# Patient Record
Sex: Male | Born: 1975
Health system: Southern US, Community
[De-identification: ages and names within clinical notes are randomized; demographics above are authoritative.]

## PROBLEM LIST (undated history)

## (undated) HISTORY — PX: KNEE SURGERY: SHX244

---

## 1997-12-02 ENCOUNTER — Emergency Department (HOSPITAL_COMMUNITY): Admission: EM | Admit: 1997-12-02 | Discharge: 1997-12-02 | Payer: Self-pay | Admitting: Emergency Medicine

## 2007-10-29 ENCOUNTER — Ambulatory Visit (HOSPITAL_COMMUNITY): Admission: RE | Admit: 2007-10-29 | Discharge: 2007-10-29 | Payer: Self-pay | Admitting: Family Medicine

## 2011-07-16 ENCOUNTER — Emergency Department (HOSPITAL_COMMUNITY)
Admission: EM | Admit: 2011-07-16 | Discharge: 2011-07-16 | Disposition: A | Discharge status: Home / Self Care | Payer: BC Managed Care – PPO | Source: Home / Self Care | Attending: Emergency Medicine | Admitting: Emergency Medicine

## 2011-07-16 ENCOUNTER — Encounter (HOSPITAL_COMMUNITY): Payer: Self-pay | Admitting: *Deleted

## 2011-07-16 DIAGNOSIS — R05 Cough: Secondary | ICD-10-CM

## 2011-07-16 DIAGNOSIS — J45909 Unspecified asthma, uncomplicated: Secondary | ICD-10-CM

## 2011-07-16 DIAGNOSIS — R059 Cough, unspecified: Secondary | ICD-10-CM

## 2011-07-16 MED ORDER — ALBUTEROL SULFATE (5 MG/ML) 0.5% IN NEBU
2.5000 mg | INHALATION_SOLUTION | Freq: Once | RESPIRATORY_TRACT | Status: AC
  Administered 2011-07-16: 2.5 mg via RESPIRATORY_TRACT

## 2011-07-16 MED ORDER — ALBUTEROL SULFATE HFA 108 (90 BASE) MCG/ACT IN AERS: 1.0000 | INHALATION_SPRAY | Freq: Four times a day (QID) | RESPIRATORY_TRACT | Status: AC | PRN

## 2011-07-16 MED ORDER — ALBUTEROL SULFATE (5 MG/ML) 0.5% IN NEBU
INHALATION_SOLUTION | RESPIRATORY_TRACT | Status: AC
  Filled 2011-07-16: qty 0.5

## 2011-07-16 MED ORDER — CETIRIZINE-PSEUDOEPHEDRINE ER 5-120 MG PO TB12: 1.0000 | ORAL_TABLET | Freq: Every day | ORAL | Status: AC

## 2011-07-16 NOTE — ED Notes (Signed)
Pt with c/o right sided chest pain sharp grabbing type pain lasts a second or two - sore after occurs - denies sob - right side of chest tender to palpation - onset of episode x one two - three weeks ago and once today

## 2011-07-16 NOTE — Discharge Instructions (Signed)
Your symptoms and physical exam, consistent with reactive airways his possibly being triggered by allergies take his medicines as prescribed.

## 2011-07-16 NOTE — ED Provider Notes (Signed)
History     CSN: [id number]  Arrival date & time 07/16/11  1308   First MD Initiated Contact with Patient 07/16/11 1316      Chief Complaint  Patient presents with  . Chest Pain    (Consider location/radiation/quality/duration/timing/severity/associated sxs/prior treatment) HPI Comments: Patient describes that have had couple of episodes in which have expressed a sudden right-sided chest pain that lasted a couple seconds. He denies any pain now or any shortness of breath. Patient describes that for the last 4 days he's been coughing and has some degree of congestion of his nose stuffy and runny but no fevers. Patient denies any shortness of breath with activity, no nausea, and have not taking any over-the-counter medicines for his symptoms.  Patient is a 36 y.o. male presenting with chest pain. The history is provided by the patient.  Chest Pain The chest pain began 12 - 24 hours ago. Chest pain occurs frequently. At its most intense, the pain is at 4/10. The quality of the pain is described as aching. The pain does not radiate. Primary symptoms include cough. Pertinent negatives for primary symptoms include no fever, no syncope, no shortness of breath, no wheezing, no palpitations, no vomiting, no dizziness and no altered mental status.  Pertinent negatives for associated symptoms include no lower extremity edema and no numbness. He tried nothing for the symptoms.     History reviewed. No pertinent past medical history.  Past Surgical History  Procedure Date  . Knee surgery     History reviewed. No pertinent family history.  History  Substance Use Topics  . Smoking status: Never Smoker   . Smokeless tobacco: Not on file  . Alcohol Use:       Review of Systems  Constitutional: Positive for chills. Negative for fever and activity change.  Respiratory: Positive for cough and chest tightness. Negative for shortness of breath, wheezing and stridor.   Cardiovascular: Positive  for chest pain. Negative for palpitations and syncope.  Gastrointestinal: Negative for vomiting.  Neurological: Negative for dizziness and numbness.  Psychiatric/Behavioral: Negative for altered mental status.    Allergies  Review of patient's allergies indicates no known allergies.  Home Medications   Current Outpatient Rx  Name Route Sig Dispense Refill  . ALBUTEROL SULFATE HFA 108 (90 BASE) MCG/ACT IN AERS Inhalation Inhale 1-2 puffs into the lungs every 6 (six) hours as needed for wheezing. 1 Inhaler 0  . CETIRIZINE-PSEUDOEPHEDRINE ER 5-120 MG PO TB12 Oral Take 1 tablet by mouth daily. 30 tablet 0    BP 147/84  Pulse 68  Temp(Src) 98.7 F (37.1 C) (Oral)  Resp 18  SpO2 97%  Physical Exam  Nursing note and vitals reviewed. Constitutional: He appears well-developed and well-nourished.  Non-toxic appearance. He does not have a sickly appearance. He does not appear ill. No distress.    HENT:  Head: Normocephalic.  Eyes: Conjunctivae are normal.  Neck: Normal range of motion. No JVD present.  Cardiovascular: Normal rate, regular rhythm and normal heart sounds.  Exam reveals no gallop and no friction rub.   No murmur heard. Pulmonary/Chest: Effort normal. No respiratory distress. He has no wheezes. He has no rales.  Abdominal: Soft.  Lymphadenopathy:    He has no cervical adenopathy.  Neurological: He is alert.  Skin: No erythema.    ED Course  Procedures (including critical care time)  Labs Reviewed - No data to display No results found.   1. Cough   2. Reactive airway disease  MDM  Cough with coexistent upper respiratory congestion and reactive airway disease. Motor exam was much improved after a bronchodilator challenge. Patient is afebrile and during exam denied having any symptoms or chest pain.        Jimmie Molly, MD 07/16/11 1505

## 2015-05-10 DIAGNOSIS — N183 Chronic kidney disease, stage 3 unspecified: Secondary | ICD-10-CM | POA: Insufficient documentation

## 2015-05-10 DIAGNOSIS — I129 Hypertensive chronic kidney disease with stage 1 through stage 4 chronic kidney disease, or unspecified chronic kidney disease: Secondary | ICD-10-CM | POA: Insufficient documentation

## 2015-11-10 DIAGNOSIS — E669 Obesity, unspecified: Secondary | ICD-10-CM | POA: Insufficient documentation

## 2016-02-02 DIAGNOSIS — I10 Essential (primary) hypertension: Secondary | ICD-10-CM | POA: Diagnosis not present

## 2016-02-02 DIAGNOSIS — J019 Acute sinusitis, unspecified: Secondary | ICD-10-CM | POA: Diagnosis not present

## 2016-02-28 DIAGNOSIS — M10062 Idiopathic gout, left knee: Secondary | ICD-10-CM | POA: Diagnosis not present

## 2016-02-28 DIAGNOSIS — M25462 Effusion, left knee: Secondary | ICD-10-CM | POA: Diagnosis not present

## 2016-02-28 DIAGNOSIS — M25562 Pain in left knee: Secondary | ICD-10-CM | POA: Diagnosis not present

## 2016-02-28 DIAGNOSIS — M25461 Effusion, right knee: Secondary | ICD-10-CM | POA: Diagnosis not present

## 2016-03-01 DIAGNOSIS — M25562 Pain in left knee: Secondary | ICD-10-CM | POA: Diagnosis not present

## 2016-03-01 DIAGNOSIS — M10362 Gout due to renal impairment, left knee: Secondary | ICD-10-CM | POA: Diagnosis not present

## 2016-05-09 DIAGNOSIS — N183 Chronic kidney disease, stage 3 (moderate): Secondary | ICD-10-CM | POA: Diagnosis not present

## 2016-05-10 DIAGNOSIS — N183 Chronic kidney disease, stage 3 (moderate): Secondary | ICD-10-CM | POA: Diagnosis not present

## 2016-05-10 DIAGNOSIS — E669 Obesity, unspecified: Secondary | ICD-10-CM | POA: Diagnosis not present

## 2016-05-10 DIAGNOSIS — I129 Hypertensive chronic kidney disease with stage 1 through stage 4 chronic kidney disease, or unspecified chronic kidney disease: Secondary | ICD-10-CM | POA: Diagnosis not present

## 2016-08-09 DIAGNOSIS — M10362 Gout due to renal impairment, left knee: Secondary | ICD-10-CM | POA: Diagnosis not present

## 2016-08-15 DIAGNOSIS — I129 Hypertensive chronic kidney disease with stage 1 through stage 4 chronic kidney disease, or unspecified chronic kidney disease: Secondary | ICD-10-CM | POA: Diagnosis not present

## 2016-08-15 DIAGNOSIS — N183 Chronic kidney disease, stage 3 (moderate): Secondary | ICD-10-CM | POA: Diagnosis not present

## 2016-08-16 DIAGNOSIS — N183 Chronic kidney disease, stage 3 (moderate): Secondary | ICD-10-CM | POA: Diagnosis not present

## 2016-08-16 DIAGNOSIS — E669 Obesity, unspecified: Secondary | ICD-10-CM | POA: Diagnosis not present

## 2016-08-16 DIAGNOSIS — I129 Hypertensive chronic kidney disease with stage 1 through stage 4 chronic kidney disease, or unspecified chronic kidney disease: Secondary | ICD-10-CM | POA: Diagnosis not present

## 2016-08-16 DIAGNOSIS — M10362 Gout due to renal impairment, left knee: Secondary | ICD-10-CM | POA: Diagnosis not present

## 2016-09-22 DIAGNOSIS — M10362 Gout due to renal impairment, left knee: Secondary | ICD-10-CM | POA: Diagnosis not present

## 2016-09-22 DIAGNOSIS — N183 Chronic kidney disease, stage 3 (moderate): Secondary | ICD-10-CM | POA: Diagnosis not present

## 2016-09-25 DIAGNOSIS — E669 Obesity, unspecified: Secondary | ICD-10-CM | POA: Diagnosis not present

## 2016-09-25 DIAGNOSIS — N183 Chronic kidney disease, stage 3 (moderate): Secondary | ICD-10-CM | POA: Diagnosis not present

## 2016-09-25 DIAGNOSIS — I129 Hypertensive chronic kidney disease with stage 1 through stage 4 chronic kidney disease, or unspecified chronic kidney disease: Secondary | ICD-10-CM | POA: Diagnosis not present

## 2017-01-24 DIAGNOSIS — N183 Chronic kidney disease, stage 3 (moderate): Secondary | ICD-10-CM | POA: Diagnosis not present

## 2017-01-25 DIAGNOSIS — I129 Hypertensive chronic kidney disease with stage 1 through stage 4 chronic kidney disease, or unspecified chronic kidney disease: Secondary | ICD-10-CM | POA: Diagnosis not present

## 2017-01-25 DIAGNOSIS — E669 Obesity, unspecified: Secondary | ICD-10-CM | POA: Diagnosis not present

## 2017-01-25 DIAGNOSIS — N183 Chronic kidney disease, stage 3 (moderate): Secondary | ICD-10-CM | POA: Diagnosis not present

## 2017-07-27 DIAGNOSIS — N183 Chronic kidney disease, stage 3 (moderate): Secondary | ICD-10-CM | POA: Diagnosis not present

## 2017-07-30 DIAGNOSIS — I129 Hypertensive chronic kidney disease with stage 1 through stage 4 chronic kidney disease, or unspecified chronic kidney disease: Secondary | ICD-10-CM | POA: Diagnosis not present

## 2017-07-30 DIAGNOSIS — E559 Vitamin D deficiency, unspecified: Secondary | ICD-10-CM | POA: Diagnosis not present

## 2017-07-30 DIAGNOSIS — N183 Chronic kidney disease, stage 3 (moderate): Secondary | ICD-10-CM | POA: Diagnosis not present

## 2017-07-30 DIAGNOSIS — E669 Obesity, unspecified: Secondary | ICD-10-CM | POA: Diagnosis not present

## 2017-10-30 ENCOUNTER — Encounter: Payer: Self-pay | Admitting: Family Medicine

## 2017-10-30 ENCOUNTER — Ambulatory Visit (INDEPENDENT_AMBULATORY_CARE_PROVIDER_SITE_OTHER): Payer: BLUE CROSS/BLUE SHIELD | Admitting: Family Medicine

## 2017-10-30 VITALS — BP 149/90 | HR 106 | Ht 70.0 in | Wt 255.0 lb

## 2017-10-30 DIAGNOSIS — S8992XA Unspecified injury of left lower leg, initial encounter: Secondary | ICD-10-CM | POA: Diagnosis not present

## 2017-10-30 MED ORDER — DICLOFENAC SODIUM 1 % TD GEL: 2.0000 g | Freq: Four times a day (QID) | TRANSDERMAL | 1 refills | Status: AC

## 2017-10-30 MED ORDER — HYDROCODONE-ACETAMINOPHEN 5-325 MG PO TABS: 1.0000 | ORAL_TABLET | Freq: Four times a day (QID) | ORAL | 0 refills | Status: DC | PRN

## 2017-10-30 NOTE — Patient Instructions (Signed)
You partially tore your patellar tendon. Wear immobilizer for next 5 weeks when up and walking around. Try to keep your knee straight other times. Icing 15 minutes at a time 3-4 times a day. Voltaren gel up to 4 times a day topically. Norco as needed for severe pain. Consider aspercreme if insurance won't cover the voltaren gel. Follow up with me in 2 weeks for reevaluation.

## 2017-11-02 ENCOUNTER — Encounter: Payer: Self-pay | Admitting: Family Medicine

## 2017-11-03 NOTE — Progress Notes (Signed)
PCP: Andreas Blower., MD  Subjective:   HPI: Patient is a 42 y.o. male here for left knee pain.  Patient reports back in 2009 he injured his left knee playing football - tore patellar tendon and had reconstruction. He improved and has had flares of gout at times in this knee. Then about a week ago he recalls turning wrong and feeling sharp pain with gradual swelling of anterior left knee. Difficulty walking and straightening his left knee since then. No fever, redness, numbness, other skin changes. Not tried anything for this yet. At rest pain 0/10 but sharp with walking, trying to straighten knee.  History reviewed. No pertinent past medical history.  Current Outpatient Medications on File Prior to Visit  Medication Sig Dispense Refill  . albuterol (PROVENTIL HFA;VENTOLIN HFA) 108 (90 BASE) MCG/ACT inhaler Inhale 1-2 puffs into the lungs every 6 (six) hours as needed for wheezing. 1 Inhaler 0  . amLODipine (NORVASC) 10 MG tablet   4  . hydrochlorothiazide (HYDRODIURIL) 12.5 MG tablet TAKE 1 TABLET PO QD  5  . Vitamin D, Ergocalciferol, (DRISDOL) 50000 units CAPS capsule TK 1 C PO 1 TIME A WK  5   No current facility-administered medications on file prior to visit.     Past Surgical History:  Procedure Laterality Date  . KNEE SURGERY      No Known Allergies  Social History   Socioeconomic History  . Marital status: Single    Spouse name: Not on file  . Number of children: Not on file  . Years of education: Not on file  . Highest education level: Not on file  Occupational History  . Not on file  Social Needs  . Financial resource strain: Not on file  . Food insecurity:    Worry: Not on file    Inability: Not on file  . Transportation needs:    Medical: Not on file    Non-medical: Not on file  Tobacco Use  . Smoking status: Never Smoker  . Smokeless tobacco: Never Used  Substance and Sexual Activity  . Alcohol use: Not on file  . Drug use: No  . Sexual  activity: Not on file  Lifestyle  . Physical activity:    Days per week: Not on file    Minutes per session: Not on file  . Stress: Not on file  Relationships  . Social connections:    Talks on phone: Not on file    Gets together: Not on file    Attends religious service: Not on file    Active member of club or organization: Not on file    Attends meetings of clubs or organizations: Not on file    Relationship status: Not on file  . Intimate partner violence:    Fear of current or ex partner: Not on file    Emotionally abused: Not on file    Physically abused: Not on file    Forced sexual activity: Not on file  Other Topics Concern  . Not on file  Social History Narrative  . Not on file    History reviewed. No pertinent family history.  BP (!) 149/90   Pulse (!) 106   Ht 5\' 10"  (1.778 m)   Wt 255 lb (115.7 kg)   BMI 36.59 kg/m   Review of Systems: See HPI above.     Objective:  Physical Exam:  Gen: NAD, comfortable in exam room  Left knee: No effusion.  Moderate amount of swelling superficial  to patellar tendon area.  Well healed anterior scar.  No other deformity.  No warmth, redness. TTP over patellar tendon.  No joint line, suprapatellar, other tenderness. FROM but with 3/5 strength and pain with extension.  5/5 strength flexion. Negative ant/post drawers. Negative valgus/varus testing. Negative lachmanns. Negative mcmurrays, apleys, patellar apprehension. NV intact distally.  Right knee: No deformity. FROM with 5/5 strength. No tenderness to palpation. NVI distally.  MSK u/s left knee:  Quad tendon intact.  Minimal effusion.  Patellar tendon has fibers visible from patella to insertion on tibial tubercle however a large hematoma, disruption noted superficial to the tendon suggestive of partial tear with hematoma.   Assessment & Plan:  1. Left knee injury - history, exam, ultrasound all consistent with partial patellar tendon tear.  Noted many fibers  still present so will treat conservatively.  Knee immobilizer for next 5 weeks with ambulation.  Stressed importance of keeping knee straight.  Icing, voltaren gel with norco as needed.  F/u in 2 weeks.

## 2017-11-13 ENCOUNTER — Encounter: Payer: Self-pay | Admitting: Family Medicine

## 2017-11-13 ENCOUNTER — Ambulatory Visit (INDEPENDENT_AMBULATORY_CARE_PROVIDER_SITE_OTHER): Payer: BLUE CROSS/BLUE SHIELD | Admitting: Family Medicine

## 2017-11-13 VITALS — BP 145/100 | HR 101 | Ht 71.0 in | Wt 255.0 lb

## 2017-11-13 DIAGNOSIS — S8992XD Unspecified injury of left lower leg, subsequent encounter: Secondary | ICD-10-CM | POA: Diagnosis not present

## 2017-11-13 NOTE — Patient Instructions (Signed)
You partially tore your patellar tendon. You're healing as expected. Wear immobilizer for next 3-4 weeks when up and walking around and follow up with me at that time. Plan to start physical therapy right after that 2 times a week. Try to keep your knee straight other times. Icing 15 minutes at a time 3-4 times a day. Norco as needed for severe pain.

## 2017-11-13 NOTE — Progress Notes (Signed)
PCP: Andreas Blower., MD  Subjective:   HPI: Patient is a 42 y.o. male here for left knee pain.  9/10: Patient reports back in 2009 he injured his left knee playing football - tore patellar tendon and had reconstruction. He improved and has had flares of gout at times in this knee. Then about a week ago he recalls turning wrong and feeling sharp pain with gradual swelling of anterior left knee. Difficulty walking and straightening his left knee since then. No fever, redness, numbness, other skin changes. Not tried anything for this yet. At rest pain 0/10 but sharp with walking, trying to straighten knee.  9/24: Patient reports he's doing better. Pain level down to 3/10. Wearing bledsoe brace locked to full extension. Has been icing, taking some ibuprofen, rare norco. No skin changes.  History reviewed. No pertinent past medical history.  Current Outpatient Medications on File Prior to Visit  Medication Sig Dispense Refill  . albuterol (PROVENTIL HFA;VENTOLIN HFA) 108 (90 BASE) MCG/ACT inhaler Inhale 1-2 puffs into the lungs every 6 (six) hours as needed for wheezing. 1 Inhaler 0  . allopurinol (ZYLOPRIM) 100 MG tablet   5  . amLODipine (NORVASC) 10 MG tablet   4  . diclofenac sodium (VOLTAREN) 1 % GEL Apply 2 g topically 4 (four) times daily. 3 Tube 1  . hydrochlorothiazide (HYDRODIURIL) 12.5 MG tablet TAKE 1 TABLET PO QD  5  . HYDROcodone-acetaminophen (NORCO) 5-325 MG tablet Take 1 tablet by mouth every 6 (six) hours as needed for moderate pain. 20 tablet 0  . Vitamin D, Ergocalciferol, (DRISDOL) 50000 units CAPS capsule TK 1 C PO 1 TIME A WK  5   No current facility-administered medications on file prior to visit.     Past Surgical History:  Procedure Laterality Date  . KNEE SURGERY      No Known Allergies  Social History   Socioeconomic History  . Marital status: Single    Spouse name: Not on file  . Number of children: Not on file  . Years of education: Not on  file  . Highest education level: Not on file  Occupational History  . Not on file  Social Needs  . Financial resource strain: Not on file  . Food insecurity:    Worry: Not on file    Inability: Not on file  . Transportation needs:    Medical: Not on file    Non-medical: Not on file  Tobacco Use  . Smoking status: Never Smoker  . Smokeless tobacco: Never Used  Substance and Sexual Activity  . Alcohol use: Not on file  . Drug use: No  . Sexual activity: Not on file  Lifestyle  . Physical activity:    Days per week: Not on file    Minutes per session: Not on file  . Stress: Not on file  Relationships  . Social connections:    Talks on phone: Not on file    Gets together: Not on file    Attends religious service: Not on file    Active member of club or organization: Not on file    Attends meetings of clubs or organizations: Not on file    Relationship status: Not on file  . Intimate partner violence:    Fear of current or ex partner: Not on file    Emotionally abused: Not on file    Physically abused: Not on file    Forced sexual activity: Not on file  Other Topics Concern  .  Not on file  Social History Narrative  . Not on file    History reviewed. No pertinent family history.  BP (!) 145/100   Pulse (!) 101   Ht 5\' 11"  (1.803 m)   Wt 255 lb (115.7 kg)   BMI 35.57 kg/m   Review of Systems: See HPI above.     Objective:  Physical Exam:  Gen: NAD, comfortable in exam room  Left knee: Swelling decreased, mild superficial to patellar tendon.  No effusion.  Well healed anterior scar.  No other deformity.  No warmth, redness. Mild TTP patellar tendon, improved. Full extension.  Strength 5/5 with extension.  Did not test flexion Negative valgus/varus testing.  NV intact distally.  MSK u/s left knee:  Quad tendon intact.  No effusion.  Patellar tendon with most fibers intact;  Disruption of superficial portion of tendon.  Hematoma improved.   Assessment &  Plan:  1. Left knee injury - 2/2 partial patellar tendon tear.  Continue immobilizer out to 6 weeks and follow up at that time.  Icing, ibuprofen with norco as needed.  Plan to start PT at follow-up, transition to knee brace.

## 2017-12-04 ENCOUNTER — Encounter: Payer: Self-pay | Admitting: Family Medicine

## 2017-12-04 ENCOUNTER — Ambulatory Visit (INDEPENDENT_AMBULATORY_CARE_PROVIDER_SITE_OTHER): Payer: BLUE CROSS/BLUE SHIELD | Admitting: Family Medicine

## 2017-12-04 VITALS — BP 159/107 | HR 98 | Ht 71.0 in | Wt 255.0 lb

## 2017-12-04 DIAGNOSIS — S8992XD Unspecified injury of left lower leg, subsequent encounter: Secondary | ICD-10-CM

## 2017-12-04 NOTE — Progress Notes (Signed)
PCP: Andreas Blower., MD  Subjective:   HPI: Patient is a 42 y.o. male here for left knee pain.  9/10: Patient reports back in 2009 he injured his left knee playing football - tore patellar tendon and had reconstruction. He improved and has had flares of gout at times in this knee. Then about a week ago he recalls turning wrong and feeling sharp pain with gradual swelling of anterior left knee. Difficulty walking and straightening his left knee since then. No fever, redness, numbness, other skin changes. Not tried anything for this yet. At rest pain 0/10 but sharp with walking, trying to straighten knee.  9/24: Patient reports he's doing better. Pain level down to 3/10. Wearing bledsoe brace locked to full extension. Has been icing, taking some ibuprofen, rare norco. No skin changes.  10/15: Patient reports he feels about the same. Pain level 2/10, feels with leaning on it or kneeling down. He's wearing his bledsoe brace when up and walking around. No skin changes.  History reviewed. No pertinent past medical history.  Current Outpatient Medications on File Prior to Visit  Medication Sig Dispense Refill  . albuterol (PROVENTIL HFA;VENTOLIN HFA) 108 (90 BASE) MCG/ACT inhaler Inhale 1-2 puffs into the lungs every 6 (six) hours as needed for wheezing. 1 Inhaler 0  . allopurinol (ZYLOPRIM) 100 MG tablet   5  . amLODipine (NORVASC) 10 MG tablet   4  . diclofenac sodium (VOLTAREN) 1 % GEL Apply 2 g topically 4 (four) times daily. 3 Tube 1  . hydrochlorothiazide (HYDRODIURIL) 12.5 MG tablet TAKE 1 TABLET PO QD  5  . HYDROcodone-acetaminophen (NORCO) 5-325 MG tablet Take 1 tablet by mouth every 6 (six) hours as needed for moderate pain. 20 tablet 0  . Vitamin D, Ergocalciferol, (DRISDOL) 50000 units CAPS capsule TK 1 C PO 1 TIME A WK  5   No current facility-administered medications on file prior to visit.     Past Surgical History:  Procedure Laterality Date  . KNEE SURGERY       No Known Allergies  Social History   Socioeconomic History  . Marital status: Single    Spouse name: Not on file  . Number of children: Not on file  . Years of education: Not on file  . Highest education level: Not on file  Occupational History  . Not on file  Social Needs  . Financial resource strain: Not on file  . Food insecurity:    Worry: Not on file    Inability: Not on file  . Transportation needs:    Medical: Not on file    Non-medical: Not on file  Tobacco Use  . Smoking status: Never Smoker  . Smokeless tobacco: Never Used  Substance and Sexual Activity  . Alcohol use: Not on file  . Drug use: No  . Sexual activity: Not on file  Lifestyle  . Physical activity:    Days per week: Not on file    Minutes per session: Not on file  . Stress: Not on file  Relationships  . Social connections:    Talks on phone: Not on file    Gets together: Not on file    Attends religious service: Not on file    Active member of club or organization: Not on file    Attends meetings of clubs or organizations: Not on file    Relationship status: Not on file  . Intimate partner violence:    Fear of current or ex partner:  Not on file    Emotionally abused: Not on file    Physically abused: Not on file    Forced sexual activity: Not on file  Other Topics Concern  . Not on file  Social History Narrative  . Not on file    History reviewed. No pertinent family history.  BP (!) 159/107   Pulse 98   Ht 5\' 11"  (1.803 m)   Wt 255 lb (115.7 kg)   BMI 35.57 kg/m   Review of Systems: See HPI above.     Objective:  Physical Exam:  Gen: NAD, comfortable in exam room  Left knee: Mild superficial swelling over patellar tendon.  No effusion.  Well healed anterior scar.  No other deformity.  Mild TTP patellar tendon.  No other tenderness. ROM 0 - 90 degrees with 5/5 strength flexion and extension. Negative ant/post drawers. Negative valgus/varus testing. Negative  lachmans. Negative mcmurrays, apleys, patellar apprehension. NV intact distally.  MSK u/s left knee:  Most of patellar tendon intact with only superficial tendon disrupted.  Neovascularity increased in patellar tendon as well.     Assessment & Plan:  1. Left knee injury - 2/2 partial patellar tendon tear with tendinitis.  Switch to hinged knee brace at this time and start physical therapy.  Icing, tylenol, topical aspercreme or capsaicin.  F/u in 4 weeks.  Hopefully can return to light duty at that time for a couple weeks at minimum then back to full duty.

## 2017-12-04 NOTE — Patient Instructions (Signed)
You partially tore your patellar tendon. You're healing as expected. Knee brace when up and walking around. Start physical therapy twice a week for at least next 4 weeks. Follow up with me in 4 weeks. Icing 15 minutes at a time 3-4 times a day. Tylenol, topical aspercreme or capsaicin as needed.

## 2017-12-20 ENCOUNTER — Ambulatory Visit: Payer: BLUE CROSS/BLUE SHIELD | Attending: Family Medicine | Admitting: Physical Therapy

## 2017-12-20 ENCOUNTER — Encounter: Payer: Self-pay | Admitting: Physical Therapy

## 2017-12-20 DIAGNOSIS — M25562 Pain in left knee: Secondary | ICD-10-CM | POA: Diagnosis not present

## 2017-12-20 DIAGNOSIS — M6281 Muscle weakness (generalized): Secondary | ICD-10-CM

## 2017-12-20 NOTE — Therapy (Signed)
[facility] [address] [place], [place], [address] Phone: [phone]   Fax:  [phone]  Physical Therapy Evaluation  Patient Details  Name: Calvin Schmidt MRN: [id number] Date of Birth: Oct 26, 1975 Referring Provider (PT): Norton Blizzard, MD   Encounter Date: 12/20/2017  PT End of Session - 12/20/17 1152    Visit Number  1    Number of Visits  12    Date for PT Re-Evaluation  01/31/18    Authorization Type  [employer]    PT Start Time  0935    PT Stop Time  1015    PT Time Calculation (min)  40 min    Activity Tolerance  Patient tolerated treatment well    Behavior During Therapy  [facility] for tasks assessed/performed       History reviewed. No pertinent past medical history.  Past Surgical History:  Procedure Laterality Date  . KNEE SURGERY      There were no vitals filed for this visit.   Subjective Assessment - 12/20/17 0936    Subjective  Pt relays he had Lt knee patella reconstruction in 2009, then he re-injured his knee in September. He saw MD, they took U.S showing it was partially torn. He was then immobilized for 5 weeks and taken out of work. 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  He now has pain and difficulty with squatitng, getting out of car, and stairs.    Pertinent History  PMH: CKD, obesity, prev knee surgery    Diagnostic tests  MSK u/s left knee:  Most of patellar tendon intact with only superficial tendon disrupted.  Neovascularity increased in patellar tendon as well.      Currently in Pain?  Yes    Pain Score  6    6 if squatting, no pain at rest   Pain Descriptors / Indicators  Throbbing    Pain Type  Acute pain    Pain Onset  More than a month ago    Pain Frequency  Intermittent    Aggravating Factors   squatting, stairs    Pain Relieving Factors  ice         [facility] PT Assessment - 12/20/17 0001      Assessment    Medical Diagnosis  Lt knee pain, partially torn patella tendon    Referring Provider (PT)  Norton Blizzard, MD    Next MD Visit  01/01/18    Prior Therapy  none      Precautions   Precautions  None      Restrictions   Weight Bearing Restrictions  No      Home Environment   Living Environment  Private residence    Additional Comments  14 steps, has to perform one step at a time currently      Prior Function   Level of Independence  Independent    Vocation  Part time employment    Vocation Requirements  builds busses      Observation/Other Assessments   Focus on Therapeutic Outcomes (FOTO)   45% limited      Sensation   Light Touch  Appears Intact      Functional Tests   Functional tests  Squat      Squat   Comments  75% ROM with squat before pain, inc wt shifting to Rt LE      ROM / Strength  AROM / PROM / Strength  AROM;Strength      AROM   Overall AROM Comments  WFL equal to Rt knee 0-120 deg bilat      Strength   Overall Strength Comments  Lt hip strength 5/5,     Strength Assessment Site  Knee    Right/Left Knee  Left    Left Knee Flexion  5/5    Left Knee Extension  4/5   limited by pain     Flexibility   Soft Tissue Assessment /Muscle Length  --   mild tightness in H.S, mod tightness in quad     Palpation   Palpation comment  TTP patella tendon      Ambulation/Gait   Gait Comments  WNL pattern                Objective measurements completed on examination: See above findings.      [facility] Adult PT Treatment/Exercise - 12/20/17 0001      Exercises   Exercises  Knee/Hip      Knee/Hip Exercises: Stretches   Quad Stretch  2 reps;Left;30 seconds    Quad Stretch Limitations  prone with strap      Knee/Hip Exercises: Seated   Long Arc Quad  Left;10 reps    Long Arc Quad Limitations  with ball sq    Sit to [employer]  10 reps   with Rt LE slight in front to facilitate more Lt LE wt shift     Knee/Hip Exercises: Supine   Quad Sets  10 reps    hold 5 sec   Straight Leg Raises  10 reps      Knee/Hip Exercises: Sidelying   Hip ABduction  10 reps    Hip ABduction Limitations  SLR, heel against wall for form             PT Education - 12/20/17 1151    Education Details  HEP, POC    Person(s) Educated  Patient    Methods  Explanation;Demonstration;Verbal cues;Handout    Comprehension  Verbalized understanding;Returned demonstration          PT Long Term Goals - 12/20/17 1156      PT LONG TERM GOAL #1   Title  Pt will be I and compliant with HEP. 6 weeks 01/31/18    Status  New      PT LONG TERM GOAL #2   Title  Pt will improve FOTO to less than 32% limited to improve function. 6 weeks 01/31/18    Status  New      PT LONG TERM GOAL #3   Title  Pt will improve Lt quad strength to 5/5 MMT. 6 weeks 01/31/18    Status  New      PT LONG TERM GOAL #4   Title  Pt will be able to lift at least 75 lbs from floor to waist without pain or difficulty. 6 weeks 01/31/18      PT LONG TERM GOAL #5   Title  Pt will be able to perform 14 stairs reciprocally without UE support or pain. 6 weeks 01/31/18    Status  New             Plan - 12/20/17 1152    Clinical Impression Statement  Pt presents with Lt knee pain and stiffness from partially torn patella tendon confirmed on U.S. He has decreased strength and stability, decreased activity toleance and decreased ability for squatting, lifting, and stairs. He will  benefit from skilled PT to address his deficits.     History and Personal Factors relevant to plan of care:  PMH: CKD, obesity, prev knee surgery for patella tendon reconstruction in 09    Clinical Presentation  Evolving    Clinical Presentation due to:  worsening pain since steptember    Clinical Decision Making  Moderate    Rehab Potential  Good    Clinical Impairments Affecting Rehab Potential  physical nature of job, and second time tearing patella tendon    PT Frequency  2x / week    PT Duration  6 weeks     PT Treatment/Interventions  Cryotherapy;Electrical Stimulation;Iontophoresis 4mg /ml Dexamethasone;Moist Heat;Ultrasound;Stair training;Therapeutic exercise;Therapeutic activities;Neuromuscular re-education;Passive range of motion;Dry needling;Taping;Vasopneumatic Device    PT Next Visit Plan  assess HEP, functional quad strengthening and progress squatting as able, quad stretching    Consulted and Agree with Plan of Care  Patient       Patient will benefit from skilled therapeutic intervention in order to improve the following deficits and impairments:  Decreased activity tolerance, Decreased endurance, Decreased range of motion, Decreased strength, Impaired flexibility, Pain  Visit Diagnosis: Acute pain of left knee  Muscle weakness (generalized)     Problem List Patient Active Problem List   Diagnosis Date Noted  . Obesity (BMI 30-39.9) 11/10/2015  . Benign hypertension with CKD (chronic kidney disease) stage III (HCC) 05/10/2015    April Manson, PT, DPT 12/20/2017, 12:03 PM  [facility] [address] [place], [place], [address] Phone: [phone]   Fax:  [phone]  Name: Calvin Schmidt MRN: [id number] Date of Birth: 1975/11/07

## 2017-12-28 ENCOUNTER — Ambulatory Visit: Payer: BLUE CROSS/BLUE SHIELD | Attending: Family Medicine | Admitting: Physical Therapy

## 2017-12-28 ENCOUNTER — Encounter: Payer: Self-pay | Admitting: Physical Therapy

## 2017-12-28 DIAGNOSIS — M6281 Muscle weakness (generalized): Secondary | ICD-10-CM | POA: Diagnosis not present

## 2017-12-28 DIAGNOSIS — M25562 Pain in left knee: Secondary | ICD-10-CM | POA: Diagnosis not present

## 2017-12-28 NOTE — Therapy (Signed)
[facility] [address] [place], [place], [address] Phone: [phone]   Fax:  [phone]  Physical Therapy Treatment  Patient Details  Name: Calvin Schmidt MRN: [id number] Date of Birth: 06/05/1975 Referring Provider (PT): Norton Blizzard, MD   Encounter Date: 12/28/2017  PT End of Session - 12/28/17 1012    Visit Number  2    Date for PT Re-Evaluation  01/31/18    PT Start Time  0927    PT Stop Time  1015    PT Time Calculation (min)  48 min    Activity Tolerance  Patient tolerated treatment well    Behavior During Therapy  [facility] for tasks assessed/performed       History reviewed. No pertinent past medical history.  Past Surgical History:  Procedure Laterality Date  . KNEE SURGERY      There were no vitals filed for this visit.  Subjective Assessment - 12/28/17 0928    Subjective  Pt reports that she is doing good overall.     Currently in Pain?  Yes    Pain Score  2     Pain Location  Knee    Pain Orientation  Left                       [facility] Adult PT Treatment/Exercise - 12/28/17 0001      Exercises   Exercises  Knee/Hip      Knee/Hip Exercises: Aerobic   Recumbent Bike  L2 x4 min     Nustep  L3 x4 min       Knee/Hip Exercises: Machines for Strengthening   Cybex Knee Extension  LLE 5lb 2x10    Cybex Knee Flexion  LLE 35lb 2x15    Cybex Leg Press  LLE 20lb 3x15       Knee/Hip Exercises: Standing   Forward Step Up  Left;10 reps;Hand Hold: 0;Step Height: 6";1 set    Other Standing Knee Exercises  Controlled decents 4in 2x10       Knee/Hip Exercises: Seated   Sit to Sand  2 sets;15 reps   6in on mat table, 4in on mat abel      Knee/Hip Exercises: Supine   Short Arc Quad Sets  Left;2 sets;10 reps    Short Arc Quad Sets Limitations  2lb    Straight Leg Raises  Left;2 sets;10 reps    Straight Leg Raises Limitations  2lb                  PT Long Term Goals - 12/28/17  1014      PT LONG TERM GOAL #1   Title  Pt will be I and compliant with HEP. 6 weeks 01/31/18    Status  Achieved            Plan - 12/28/17 1014    Clinical Impression Statement  Pt did well with all interventions overall, He reports the most difficulty with single leg extensions. Visible shaking with controlled descents during the eccentric phase. Some compensation during sit to stands even with elevated surface, pt tends to lean to his right    Rehab Potential  Good    Clinical Impairments Affecting Rehab Potential  physical nature of job, and second time tearing patella tendon    PT Frequency  2x / week    PT Duration  6 weeks    PT Treatment/Interventions  Cryotherapy;Electrical Stimulation;Iontophoresis 4mg /ml Dexamethasone;Moist Heat;Ultrasound;Stair training;Therapeutic  exercise;Therapeutic activities;Neuromuscular re-education;Passive range of motion;Dry needling;Taping;Vasopneumatic Device    PT Next Visit Plan   functional quad strengthening and progress squatting as able, quad stretching       Patient will benefit from skilled therapeutic intervention in order to improve the following deficits and impairments:  Decreased activity tolerance, Decreased endurance, Decreased range of motion, Decreased strength, Impaired flexibility, Pain  Visit Diagnosis: Muscle weakness (generalized)  Acute pain of left knee     Problem List Patient Active Problem List   Diagnosis Date Noted  . Obesity (BMI 30-39.9) 11/10/2015  . Benign hypertension with CKD (chronic kidney disease) stage III (HCC) 05/10/2015    Grayce Sessions 12/28/2017, 10:19 AM  [facility] [address], [place], [address] Phone: [phone]   Fax:  [phone]  Name: Calvin Schmidt MRN: [id number] Date of Birth: 1975/12/16

## 2018-01-01 ENCOUNTER — Encounter: Payer: Self-pay | Admitting: Family Medicine

## 2018-01-01 ENCOUNTER — Ambulatory Visit (INDEPENDENT_AMBULATORY_CARE_PROVIDER_SITE_OTHER): Payer: BLUE CROSS/BLUE SHIELD | Admitting: Family Medicine

## 2018-01-01 VITALS — BP 169/98 | HR 97 | Ht 70.0 in | Wt 255.0 lb

## 2018-01-01 DIAGNOSIS — S8992XD Unspecified injury of left lower leg, subsequent encounter: Secondary | ICD-10-CM

## 2018-01-01 NOTE — Patient Instructions (Signed)
Knee brace when up and walking around. Do knee extensions 3 sets of 10 daily and half squats. We can get you into physical therapy elsewhere if it's too hard to get into the [facility] location - going once a week while you also do the home exercises would be ideal. Follow up with me in 5 weeks. Icing 15 minutes at a time 3-4 times a day. Tylenol, topical aspercreme or capsaicin as needed.

## 2018-01-01 NOTE — Progress Notes (Signed)
PCP: Andreas Blower., MD  Subjective:   HPI: Patient is a 42 y.o. male here for left knee pain.  9/10: Patient reports back in 2009 he injured his left knee playing football - tore patellar tendon and had reconstruction. He improved and has had flares of gout at times in this knee. Then about a week ago he recalls turning wrong and feeling sharp pain with gradual swelling of anterior left knee. Difficulty walking and straightening his left knee since then. No fever, redness, numbness, other skin changes. Not tried anything for this yet. At rest pain 0/10 but sharp with walking, trying to straighten knee.  9/24: Patient reports he's doing better. Pain level down to 3/10. Wearing bledsoe brace locked to full extension. Has been icing, taking some ibuprofen, rare norco. No skin changes.  10/15: Patient reports he feels about the same. Pain level 2/10, feels with leaning on it or kneeling down. He's wearing his bledsoe brace when up and walking around. No skin changes.  11/12: Patient reports he feels about the same. Has only done 2 visits of physical therapy to date and some home exercises. Had trouble doing even 10 pound knee extensions left leg. Wearing knee brace but not currently. Pain level 3/10 and sore, worse kneeling. Has been icing. No skin changes.  History reviewed. No pertinent past medical history.  Current Outpatient Medications on File Prior to Visit  Medication Sig Dispense Refill  . albuterol (PROVENTIL HFA;VENTOLIN HFA) 108 (90 BASE) MCG/ACT inhaler Inhale 1-2 puffs into the lungs every 6 (six) hours as needed for wheezing. 1 Inhaler 0  . allopurinol (ZYLOPRIM) 100 MG tablet   5  . amLODipine (NORVASC) 10 MG tablet   4  . diclofenac sodium (VOLTAREN) 1 % GEL Apply 2 g topically 4 (four) times daily. 3 Tube 1  . hydrochlorothiazide (HYDRODIURIL) 12.5 MG tablet TAKE 1 TABLET PO QD  5  . HYDROcodone-acetaminophen (NORCO) 5-325 MG tablet Take 1 tablet by  mouth every 6 (six) hours as needed for moderate pain. 20 tablet 0  . Vitamin D, Ergocalciferol, (DRISDOL) 50000 units CAPS capsule TK 1 C PO 1 TIME A WK  5   No current facility-administered medications on file prior to visit.     Past Surgical History:  Procedure Laterality Date  . KNEE SURGERY      No Known Allergies  Social History   Socioeconomic History  . Marital status: Single    Spouse name: Not on file  . Number of children: Not on file  . Years of education: Not on file  . Highest education level: Not on file  Occupational History  . Not on file  Social Needs  . Financial resource strain: Not on file  . Food insecurity:    Worry: Not on file    Inability: Not on file  . Transportation needs:    Medical: Not on file    Non-medical: Not on file  Tobacco Use  . Smoking status: Never Smoker  . Smokeless tobacco: Never Used  Substance and Sexual Activity  . Alcohol use: Not on file  . Drug use: No  . Sexual activity: Not on file  Lifestyle  . Physical activity:    Days per week: Not on file    Minutes per session: Not on file  . Stress: Not on file  Relationships  . Social connections:    Talks on phone: Not on file    Gets together: Not on file  Attends religious service: Not on file    Active member of club or organization: Not on file    Attends meetings of clubs or organizations: Not on file    Relationship status: Not on file  . Intimate partner violence:    Fear of current or ex partner: Not on file    Emotionally abused: Not on file    Physically abused: Not on file    Forced sexual activity: Not on file  Other Topics Concern  . Not on file  Social History Narrative  . Not on file    History reviewed. No pertinent family history.  BP (!) 169/98   Pulse 97   Ht 5\' 10"  (1.778 m)   Wt 255 lb (115.7 kg)   BMI 36.59 kg/m   Review of Systems: See HPI above.     Objective:  Physical Exam:  Gen: NAD, comfortable in exam room  Left  knee: Mild superficial swelling over patellar tendon.  Well healed surgical scar.  No other gross deformity, ecchymoses, swelling. Mild TTP patellar tendon.  No other tenderness. ROM 0 - 120 degrees with 5/5 strength flexion and extension grossly. Negative ant/post drawers. Negative valgus/varus testing. Negative lachmans. Negative mcmurrays, apleys, patellar apprehension. NV intact distally.   Assessment & Plan:  1. Left knee injury - 2/2 partial patellar tendon tear with tendinitis.  Hinged knee brace.  Switch to different PT place to get him in when more convenient for him.  Shown more home exercises to do daily.  Icing, tylenol, topical medications.  F/u in 5 weeks.  Continue current work restrictions.

## 2018-01-07 DIAGNOSIS — I129 Hypertensive chronic kidney disease with stage 1 through stage 4 chronic kidney disease, or unspecified chronic kidney disease: Secondary | ICD-10-CM | POA: Diagnosis not present

## 2018-01-07 DIAGNOSIS — N183 Chronic kidney disease, stage 3 (moderate): Secondary | ICD-10-CM | POA: Diagnosis not present

## 2018-01-07 DIAGNOSIS — R400 Somnolence: Secondary | ICD-10-CM | POA: Diagnosis not present

## 2018-01-07 DIAGNOSIS — E669 Obesity, unspecified: Secondary | ICD-10-CM | POA: Diagnosis not present

## 2018-01-07 DIAGNOSIS — Z23 Encounter for immunization: Secondary | ICD-10-CM | POA: Diagnosis not present

## 2018-01-29 DIAGNOSIS — N183 Chronic kidney disease, stage 3 (moderate): Secondary | ICD-10-CM | POA: Diagnosis not present

## 2018-01-29 DIAGNOSIS — I129 Hypertensive chronic kidney disease with stage 1 through stage 4 chronic kidney disease, or unspecified chronic kidney disease: Secondary | ICD-10-CM | POA: Diagnosis not present

## 2018-01-30 DIAGNOSIS — N183 Chronic kidney disease, stage 3 (moderate): Secondary | ICD-10-CM | POA: Diagnosis not present

## 2018-01-30 DIAGNOSIS — I129 Hypertensive chronic kidney disease with stage 1 through stage 4 chronic kidney disease, or unspecified chronic kidney disease: Secondary | ICD-10-CM | POA: Diagnosis not present

## 2018-01-30 DIAGNOSIS — E669 Obesity, unspecified: Secondary | ICD-10-CM | POA: Diagnosis not present

## 2018-02-01 ENCOUNTER — Ambulatory Visit: Payer: BLUE CROSS/BLUE SHIELD | Attending: Family Medicine | Admitting: Physical Therapy

## 2018-02-01 DIAGNOSIS — M25562 Pain in left knee: Secondary | ICD-10-CM | POA: Diagnosis not present

## 2018-02-01 DIAGNOSIS — M6281 Muscle weakness (generalized): Secondary | ICD-10-CM | POA: Diagnosis not present

## 2018-02-01 NOTE — Therapy (Signed)
[place], [place], [address] Phone: [phone]   Fax:  [phone]  Physical Therapy Treatment  Patient Details  Name: Calvin Schmidt MRN: [id number] Date of Birth: 1976/02/13 Referring Provider (PT): Karlton Lemon, MD   Encounter Date: 02/01/2018  PT End of Session - 02/01/18 0913    Visit Number  3    Number of Visits  12    PT Start Time  0840    PT Stop Time  0935    PT Time Calculation (min)  55 min       No past medical history on file.  Past Surgical History:  Procedure Laterality Date  . KNEE SURGERY      There were no vitals filed for this visit.  Subjective Assessment - 02/01/18 0908    Subjective  pt arrives not being in PT for 5 weeks ,verb working out and trying to strengthening but it just hurts and clicks    Currently in Pain?  Yes    Pain Score  6     Pain Location  Knee    Pain Orientation  Medial         [facility] PT Assessment - 02/01/18 0001      AROM   Overall AROM Comments  WFL equal to Rt knee 0-120 deg bilat      Strength   Left Knee Extension  4+/5   pain, good VMO and tracking     Palpation   Palpation comment  TTP patella tendon   significant swelling noted                  [facility] Adult PT Treatment/Exercise - 02/01/18 0001      Knee/Hip Exercises: Aerobic   Elliptical  5 mn I 4 R 4   educ on doing at gym VS TM d/t impact     Modalities   Modalities  Iontophoresis;Electrical Stimulation;Cryotherapy;Ultrasound      Cryotherapy   Number Minutes Cryotherapy  15 Minutes    Cryotherapy Location  Knee    Type of Cryotherapy  Ice pack      Electrical Stimulation   Electrical Stimulation Location  left  knee/pat    Electrical Stimulation Action  IFC    Electrical Stimulation Parameters  supne with elevation    Electrical Stimulation Goals  Edema;Pain      Ultrasound   Ultrasound Location  Left pat tendon    Ultrasound Parameters   1.3 w/cm2 100% cont    Ultrasound Goals  Edema;Pain      Iontophoresis   Type of Iontophoresis  Dexamethasone    Location  left pat tendon    Dose  1.2 CC 80 mA patch    Time  4-6 hours             PT Education - 02/01/18 0911    Education Details  discussed gym ex and safety, reduce impact, reduce wt , no SLS squat and avoid full TKE to decrease pressure on pat tendon    Person(s) Educated  Patient    Methods  Explanation;Demonstration    Comprehension  Verbalized understanding          PT Long Term Goals - 02/01/18 0912      PT LONG TERM GOAL #1   Title  Pt will be I and compliant with HEP. 6 weeks 01/31/18    Status  Partially Met      PT LONG  TERM GOAL #2   Title  Pt will improve FOTO to less than 32% limited to improve function. 6 weeks 01/31/18    Status  On-going      PT LONG TERM GOAL #3   Title  Pt will improve Lt quad strength to 5/5 MMT. 6 weeks 01/31/18    Status  Partially Met      PT LONG TERM GOAL #4   Title  Pt will be able to lift at least 75 lbs from floor to waist without pain or difficulty. 6 weeks 01/31/18    Status  Partially Met      PT LONG TERM GOAL #5   Title  Pt will be able to perform 14 stairs reciprocally without UE support or pain. 6 weeks 01/31/18    Status  Partially Met            Plan - 02/01/18 0914    Clinical Impression Statement  good ROM and MMT, still pain and minimal weakness with TKE. good VMO and pat tracking. pt has not been here in 5 weeks verb trying to ex and strengthening but pain worse.pat. tender very tenden and swollen. educ on gym safety and ex. today focus session in decreasing pat inflammation.    PT Treatment/Interventions  Cryotherapy;Electrical Stimulation;Iontophoresis 4mg/ml Dexamethasone;Moist Heat;Ultrasound;Stair training;Therapeutic exercise;Therapeutic activities;Neuromuscular re-education;Passive range of motion;Dry needling;Taping;Vasopneumatic Device    PT Next Visit Plan  assess tx after  todays modalities and assess how gym modifications are going in re: to pain       Patient will benefit from skilled therapeutic intervention in order to improve the following deficits and impairments:  Decreased activity tolerance, Decreased endurance, Decreased range of motion, Decreased strength, Impaired flexibility, Pain  Visit Diagnosis: Acute pain of left knee  Muscle weakness (generalized)     Problem List Patient Active Problem List   Diagnosis Date Noted  . Obesity (BMI 30-39.9) 11/10/2015  . Benign hypertension with CKD (chronic kidney disease) stage III (HCC) 05/10/2015    PAYSEUR,ANGIE PTA 02/01/2018, 9:16 AM  [facility] [address] [place], [place], [address] Phone: [phone]   Fax:  [phone]  Name: Calvin Schmidt MRN: [id number] Date of Birth: 07/31/1975   

## 2018-02-05 ENCOUNTER — Encounter: Payer: Self-pay | Admitting: Family Medicine

## 2018-02-05 ENCOUNTER — Ambulatory Visit (INDEPENDENT_AMBULATORY_CARE_PROVIDER_SITE_OTHER): Payer: BLUE CROSS/BLUE SHIELD | Admitting: Family Medicine

## 2018-02-05 VITALS — BP 159/107 | HR 91 | Ht 70.0 in | Wt 260.0 lb

## 2018-02-05 DIAGNOSIS — S8992XD Unspecified injury of left lower leg, subsequent encounter: Secondary | ICD-10-CM | POA: Diagnosis not present

## 2018-02-05 MED ORDER — NITROGLYCERIN 0.2 MG/HR TD PT24: MEDICATED_PATCH | TRANSDERMAL | 1 refills | Status: AC

## 2018-02-05 NOTE — Patient Instructions (Signed)
Try the nitro patches 1/4th patch to affected knee, change daily. Continue home exercises - increase frequency of your physical therapy. Consider MRI but given your exam this is unlikely to show additional problems that would change management. Follow up with me in 6 weeks.

## 2018-02-05 NOTE — Progress Notes (Signed)
PCP: Andreas Blower., MD  Subjective:   HPI: Patient is a 42 y.o. male here for left knee pain.  9/10: Patient reports back in 2009 he injured his left knee playing football - tore patellar tendon and had reconstruction. He improved and has had flares of gout at times in this knee. Then about a week ago he recalls turning wrong and feeling sharp pain with gradual swelling of anterior left knee. Difficulty walking and straightening his left knee since then. No fever, redness, numbness, other skin changes. Not tried anything for this yet. At rest pain 0/10 but sharp with walking, trying to straighten knee.  9/24: Patient reports he's doing better. Pain level down to 3/10. Wearing bledsoe brace locked to full extension. Has been icing, taking some ibuprofen, rare norco. No skin changes.  10/15: Patient reports he feels about the same. Pain level 2/10, feels with leaning on it or kneeling down. He's wearing his bledsoe brace when up and walking around. No skin changes.  11/12: Patient reports he feels about the same. Has only done 2 visits of physical therapy to date and some home exercises. Had trouble doing even 10 pound knee extensions left leg. Wearing knee brace but not currently. Pain level 3/10 and sore, worse kneeling. Has been icing. No skin changes.  12/17: Patient has done one visit of PT since last visit but states he's doing home exercises daily. Pain anterior knee and behind patella, in patellar tendon area. Some clicking, popping. Pain 0/10 but up to 6/10 at times. Able to do stairs better. No skin changes.  History reviewed. No pertinent past medical history.  Current Outpatient Medications on File Prior to Visit  Medication Sig Dispense Refill  . lisinopril (PRINIVIL,ZESTRIL) 10 MG tablet Take by mouth.    Marland Kitchen albuterol (PROVENTIL HFA;VENTOLIN HFA) 108 (90 BASE) MCG/ACT inhaler Inhale 1-2 puffs into the lungs every 6 (six) hours as needed for wheezing. 1  Inhaler 0  . allopurinol (ZYLOPRIM) 100 MG tablet   5  . amLODipine (NORVASC) 10 MG tablet   4  . diclofenac sodium (VOLTAREN) 1 % GEL Apply 2 g topically 4 (four) times daily. 3 Tube 1  . hydrochlorothiazide (HYDRODIURIL) 12.5 MG tablet TAKE 1 TABLET PO QD  5  . Vitamin D, Ergocalciferol, (DRISDOL) 50000 units CAPS capsule TK 1 C PO 1 TIME A WK  5   No current facility-administered medications on file prior to visit.     Past Surgical History:  Procedure Laterality Date  . KNEE SURGERY      No Known Allergies  Social History   Socioeconomic History  . Marital status: Single    Spouse name: Not on file  . Number of children: Not on file  . Years of education: Not on file  . Highest education level: Not on file  Occupational History  . Not on file  Social Needs  . Financial resource strain: Not on file  . Food insecurity:    Worry: Not on file    Inability: Not on file  . Transportation needs:    Medical: Not on file    Non-medical: Not on file  Tobacco Use  . Smoking status: Never Smoker  . Smokeless tobacco: Never Used  Substance and Sexual Activity  . Alcohol use: Not on file  . Drug use: No  . Sexual activity: Not on file  Lifestyle  . Physical activity:    Days per week: Not on file    Minutes per  session: Not on file  . Stress: Not on file  Relationships  . Social connections:    Talks on phone: Not on file    Gets together: Not on file    Attends religious service: Not on file    Active member of club or organization: Not on file    Attends meetings of clubs or organizations: Not on file    Relationship status: Not on file  . Intimate partner violence:    Fear of current or ex partner: Not on file    Emotionally abused: Not on file    Physically abused: Not on file    Forced sexual activity: Not on file  Other Topics Concern  . Not on file  Social History Narrative  . Not on file    History reviewed. No pertinent family history.  BP (!)  159/107   Pulse 91   Ht 5\' 10"  (1.778 m)   Wt 260 lb (117.9 kg)   BMI 37.31 kg/m   Review of Systems: See HPI above.     Objective:  Physical Exam:  Gen: NAD, comfortable in exam room  Left knee: Mild swelling over patellar tendon.  Well healed surgical scar.  No other deformity, effusion. Mild TTP post patellar facets, patellar tendon.  No other tenderness. ROM 0 - 120 degrees, 5/5 strength flexion and extension with mild pain extension. Negative mcmurrays, apleys, valgus/varus, lachmans, anterior and posterior drawers. NVI distally.   Assessment & Plan:  1. Left knee injury - 2/2 partial patellar tendon tear with tendinitis and patellofemoral syndrome.  Add nitro patches, encouraged more regular physical therapy with his home exercises.  Consider MRI but exam not suggestive of intraarticular pathology.  F/u in 6 weeks.

## 2018-02-08 ENCOUNTER — Ambulatory Visit: Payer: BLUE CROSS/BLUE SHIELD | Admitting: Physical Therapy

## 2018-02-08 ENCOUNTER — Encounter: Payer: Self-pay | Admitting: Physical Therapy

## 2018-02-08 DIAGNOSIS — M25562 Pain in left knee: Secondary | ICD-10-CM

## 2018-02-08 DIAGNOSIS — M6281 Muscle weakness (generalized): Secondary | ICD-10-CM

## 2018-02-08 NOTE — Therapy (Signed)
[place], [place], [address] Phone: [phone]   Fax:  [phone]  Physical Therapy Treatment  Patient Details  Name: Calvin Schmidt MRN: [id number] Date of Birth: 10/22/1975 Referring Provider (PT): Karlton Lemon, MD   Encounter Date: 02/08/2018  PT End of Session - 02/08/18 1057    Visit Number  4    Number of Visits  12    Date for PT Re-Evaluation  03/03/18    PT Start Time  7867    PT Stop Time  1058    PT Time Calculation (min)  43 min    Activity Tolerance  Patient tolerated treatment well    Behavior During Therapy  [facility] for tasks assessed/performed       History reviewed. No pertinent past medical history.  Past Surgical History:  Procedure Laterality Date  . KNEE SURGERY      There were no vitals filed for this visit.  Subjective Assessment - 02/08/18 1017    Subjective  Pt reports that he has back on the things he has been doing at the gym    Currently in Pain?  Yes    Pain Score  3     Pain Location  Knee                       [facility] Adult PT Treatment/Exercise - 02/08/18 0001      Knee/Hip Exercises: Aerobic   Recumbent Bike  L2 x4 min       Knee/Hip Exercises: Machines for Strengthening   Cybex Knee Extension  LLE eccentric 15lb 2x10     Cybex Knee Flexion  LLE 35lb 2x15      Knee/Hip Exercises: Seated   Sit to Sand  2 sets;15 reps;without UE support   4in on mat table, green tband      Knee/Hip Exercises: Supine   Short Arc Quad Sets  Left;2 sets;15 reps    Short Arc Quad Sets Limitations  2lb    Straight Leg Raises  Left;2 sets;10 reps    Straight Leg Raises Limitations  2lb    Straight Leg Raise with External Rotation  Left;2 sets;10 reps;Strengthening    Straight Leg Raise with External Rotation Limitations  2lb                  PT Long Term Goals - 02/01/18 0912      PT LONG TERM GOAL #1   Title  Pt will be I and compliant  with HEP. 6 weeks 01/31/18    Status  Partially Met      PT LONG TERM GOAL #2   Title  Pt will improve FOTO to less than 32% limited to improve function. 6 weeks 01/31/18    Status  On-going      PT LONG TERM GOAL #3   Title  Pt will improve Lt quad strength to 5/5 MMT. 6 weeks 01/31/18    Status  Partially Met      PT LONG TERM GOAL #4   Title  Pt will be able to lift at least 75 lbs from floor to waist without pain or difficulty. 6 weeks 01/31/18    Status  Partially Met      PT LONG TERM GOAL #5   Title  Pt will be able to perform 14 stairs reciprocally without UE support or pain. 6 weeks 01/31/18    Status  Partially Met  Plan - 02/08/18 1059    Clinical Impression Statement  Good ROM and MMT, Quad fatigues quick with eccentric  extensions. Reports he could feel the sit to stand with TKE working. Did well with all supine exercises.     Rehab Potential  Good    Clinical Impairments Affecting Rehab Potential  physical nature of job, and second time tearing patella tendon    PT Frequency  2x / week    PT Duration  6 weeks    PT Treatment/Interventions  Cryotherapy;Electrical Stimulation;Iontophoresis 75m/ml Dexamethasone;Moist Heat;Ultrasound;Stair training;Therapeutic exercise;Therapeutic activities;Neuromuscular re-education;Passive range of motion;Dry needling;Taping;Vasopneumatic Device    PT Next Visit Plan  Slowly progress with L quad strength       Patient will benefit from skilled therapeutic intervention in order to improve the following deficits and impairments:  Decreased activity tolerance, Decreased endurance, Decreased range of motion, Decreased strength, Impaired flexibility, Pain  Visit Diagnosis: Acute pain of left knee  Muscle weakness (generalized)     Problem List Patient Active Problem List   Diagnosis Date Noted  . Obesity (BMI 30-39.9) 11/10/2015  . Benign hypertension with CKD (chronic kidney disease) stage III ([facility] 05/10/2015     RScot Jun, PTA 02/08/2018, 11:02 AM  [place] [address]Phone: [phone]  Fax:  [phone] Name: Calvin BirchardMRN: [id number] of Birth: 306/24/1977

## 2018-02-19 ENCOUNTER — Encounter: Payer: Self-pay | Admitting: Physical Therapy

## 2018-02-19 ENCOUNTER — Ambulatory Visit: Payer: BLUE CROSS/BLUE SHIELD | Admitting: Physical Therapy

## 2018-02-19 DIAGNOSIS — M25562 Pain in left knee: Secondary | ICD-10-CM | POA: Diagnosis not present

## 2018-02-19 DIAGNOSIS — M6281 Muscle weakness (generalized): Secondary | ICD-10-CM

## 2018-02-19 NOTE — Therapy (Signed)
[place], [place], [address] Phone: [phone]   Fax:  [phone]  Physical Therapy Treatment  Patient Details  Name: Calvin Schmidt MRN: [id number] Date of Birth: [birth year] Referring Provider (PT): Karlton Lemon, MD   Encounter Date: 02/19/2018  PT End of Session - 02/19/18 0933    Visit Number  5    Date for PT Re-Evaluation  03/03/18    PT Start Time  0848    PT Stop Time  0933    PT Time Calculation (min)  45 min    Activity Tolerance  Patient tolerated treatment well    Behavior During Therapy  [facility] for tasks assessed/performed       History reviewed. No pertinent past medical history.  Past Surgical History:  Procedure Laterality Date  . KNEE SURGERY      There were no vitals filed for this visit.  Subjective Assessment - 02/19/18 0849    Subjective  Pt stated that he has cut back on some of his exercises since last session.     Currently in Pain?  Yes    Pain Score  6     Pain Location  Knee    Pain Orientation  Left    Pain Descriptors / Indicators  Aching                       [facility] Adult PT Treatment/Exercise - 02/19/18 0001      Knee/Hip Exercises: Aerobic   Recumbent Bike  L2 x6 min       Knee/Hip Exercises: Machines for Strengthening   Cybex Knee Extension  LLE eccentric 15lb 2x10, LLE 10 lb x20, LLE 5lb x30     Cybex Knee Flexion  LLE 35lb 2x15      Knee/Hip Exercises: Standing   Lateral Step Up  Left;1 set;10 reps;Hand Hold: 0;Step Height: 6"    Forward Step Up  Left;10 reps;Hand Hold: 0;Step Height: 6";1 set    Other Standing Knee Exercises  Controlled decents 4in 2x10       Knee/Hip Exercises: Seated   Sit to Sand  2 sets;15 reps;without UE support   2in on mat table, green band TKE      Knee/Hip Exercises: Supine   Short Arc Quad Sets  Left;2 sets;15 reps    Short Arc Quad Sets Limitations  3    Straight Leg Raises  Left;2 sets;10 reps     Straight Leg Raises Limitations  3    Straight Leg Raise with External Rotation  Left;2 sets;10 reps;Strengthening    Straight Leg Raise with External Rotation Limitations  3                  PT Long Term Goals - 02/19/18 6294      PT LONG TERM GOAL #1   Title  Pt will be I and compliant with HEP. 6 weeks 01/31/18    Status  Achieved      PT LONG TERM GOAL #2   Title  Pt will improve FOTO to less than 32% limited to improve function. 6 weeks 01/31/18    Status  On-going      PT LONG TERM GOAL #3   Title  Pt will improve Lt quad strength to 5/5 MMT. 6 weeks 01/31/18    Status  Partially Met      PT LONG TERM GOAL #4   Title  Pt will be  able to lift at least 75 lbs from floor to waist without pain or difficulty. 6 weeks 01/31/18    Status  Partially Met      PT LONG TERM GOAL #5   Title  Pt will be able to perform 14 stairs reciprocally without UE support or pain. 6 weeks 01/31/18            Plan - 02/19/18 0934    Clinical Impression Statement  Pt did well with all supine exercises. Tends to lean away from LLE with sit to stands. Weakness noted with controlled descents and eccentric leg extensions. No reports of increase pain.     Rehab Potential  Good    Clinical Impairments Affecting Rehab Potential  physical nature of job, and second time tearing patella tendon    PT Frequency  2x / week    PT Duration  6 weeks    PT Treatment/Interventions  Cryotherapy;Electrical Stimulation;Iontophoresis 23m/ml Dexamethasone;Moist Heat;Ultrasound;Stair training;Therapeutic exercise;Therapeutic activities;Neuromuscular re-education;Passive range of motion;Dry needling;Taping;Vasopneumatic Device    PT Next Visit Plan  Slowly progress with L quad strength       Patient will benefit from skilled therapeutic intervention in order to improve the following deficits and impairments:  Decreased activity tolerance, Decreased endurance, Decreased range of motion, Decreased  strength, Impaired flexibility, Pain  Visit Diagnosis: Acute pain of left knee  Muscle weakness (generalized)     Problem List Patient Active Problem List   Diagnosis Date Noted  . Obesity (BMI 30-39.9) 11/10/2015  . Benign hypertension with CKD (chronic kidney disease) stage III ([facility] 05/10/2015    RScot Jun PTA 02/19/2018, 9:40 AM  [place] [address]Phone: [phone]  Fax:  [phone] Name: Calvin Schmidt NeedsMRN: [id number] of Birth: [birth year]

## 2018-02-21 ENCOUNTER — Encounter: Payer: Self-pay | Admitting: Physical Therapy

## 2018-02-21 ENCOUNTER — Ambulatory Visit: Payer: BLUE CROSS/BLUE SHIELD | Attending: Family Medicine | Admitting: Physical Therapy

## 2018-02-21 DIAGNOSIS — M6281 Muscle weakness (generalized): Secondary | ICD-10-CM | POA: Insufficient documentation

## 2018-02-21 DIAGNOSIS — M25562 Pain in left knee: Secondary | ICD-10-CM

## 2018-02-21 NOTE — Therapy (Signed)
[place], [place], [address] Phone: [phone]   Fax:  [phone]  Physical Therapy Treatment  Patient Details  Name: Calvin Schmidt MRN: [id number] Date of Birth: 1975-12-25 Referring Provider (PT): Karlton Lemon, MD   Encounter Date: 02/21/2018  PT End of Session - 02/21/18 0841    Visit Number  6    Date for PT Re-Evaluation  03/03/18    PT Start Time  5885    PT Stop Time  0841    PT Time Calculation (min)  44 min    Activity Tolerance  Patient tolerated treatment well    Behavior During Therapy  [facility] for tasks assessed/performed       History reviewed. No pertinent past medical history.  Past Surgical History:  Procedure Laterality Date  . KNEE SURGERY      There were no vitals filed for this visit.  Subjective Assessment - 02/21/18 0758    Subjective  "Im still sore from the other day"    Currently in Pain?  Yes    Pain Score  8     Pain Location  Knee    Pain Orientation  Left         [facility] PT Assessment - 02/21/18 0001      Squat   Comments  100% squat with no pain       AROM   Overall AROM Comments  WFL equal to Rt knee 0-120 deg bilat      Strength   Left Knee Extension  5/5                   [facility] Adult PT Treatment/Exercise - 02/21/18 0001      High Level Balance   High Level Balance Comments  SLS 3 way rebound ball toss  x10 each then on airex       Knee/Hip Exercises: Aerobic   Recumbent Bike  L2 x6 min       Knee/Hip Exercises: Machines for Strengthening   Cybex Knee Flexion  LLE 35lb 2x15      Knee/Hip Exercises: Standing   Heel Raises  Left;2 sets;10 reps;2 seconds    Forward Step Up  Left;2 sets;10 reps;Hand Hold: 0;Step Height: 6"   green TKE around LLE    Walking with Sports Cord  70lb 4 way x 5 each    Other Standing Knee Exercises  Controlled decents 4in 2x10     Other Standing Knee Exercises  SL LLE DL 9lb 2x10       Knee/Hip  Exercises: Seated   Sit to Sand  2 sets;15 reps;without UE support   green Tband TKE LLE                  PT Long Term Goals - 02/19/18 0939      PT LONG TERM GOAL #1   Title  Pt will be I and compliant with HEP. 6 weeks 01/31/18    Status  Achieved      PT LONG TERM GOAL #2   Title  Pt will improve FOTO to less than 32% limited to improve function. 6 weeks 01/31/18    Status  On-going      PT LONG TERM GOAL #3   Title  Pt will improve Lt quad strength to 5/5 MMT. 6 weeks 01/31/18    Status  Partially Met      PT LONG TERM GOAL #4  Title  Pt will be able to lift at least 75 lbs from floor to waist without pain or difficulty. 6 weeks 01/31/18    Status  Partially Met      PT LONG TERM GOAL #5   Title  Pt will be able to perform 14 stairs reciprocally without UE support or pain. 6 weeks 01/31/18            Plan - 02/21/18 0841    Clinical Impression Statement  Pt enters clinic reporting some soreness form last treatment session so avoided any open chain L quad strengthening. Cues needed to shorted stride with step ups to gain more ROM. Pt with an improved squat ability and control.  SLS instability noted with DL and ball toss.     Rehab Potential  Good    Clinical Impairments Affecting Rehab Potential  physical nature of job, and second time tearing patella tendon    PT Frequency  2x / week    PT Duration  6 weeks    PT Treatment/Interventions  Cryotherapy;Electrical Stimulation;Iontophoresis 49m/ml Dexamethasone;Moist Heat;Ultrasound;Stair training;Therapeutic exercise;Therapeutic activities;Neuromuscular re-education;Passive range of motion;Dry needling;Taping;Vasopneumatic Device    PT Next Visit Plan  Slowly progress with L quad strength       Patient will benefit from skilled therapeutic intervention in order to improve the following deficits and impairments:  Decreased activity tolerance, Decreased endurance, Decreased range of motion, Decreased strength,  Impaired flexibility, Pain  Visit Diagnosis: Muscle weakness (generalized)  Acute pain of left knee     Problem List Patient Active Problem List   Diagnosis Date Noted  . Obesity (BMI 30-39.9) 11/10/2015  . Benign hypertension with CKD (chronic kidney disease) stage III ([place] 05/10/2015    RScot Jun PTA 02/21/2018, 8:43 AM  [place] [address]Phone: [phone]  Fax:  [phone] Name: Calvin LenhoffMRN: [id number] of Birth: 306/30/1977

## 2018-02-26 ENCOUNTER — Ambulatory Visit: Payer: BLUE CROSS/BLUE SHIELD | Admitting: Physical Therapy

## 2018-02-26 ENCOUNTER — Encounter: Payer: Self-pay | Admitting: Physical Therapy

## 2018-02-26 DIAGNOSIS — M25562 Pain in left knee: Secondary | ICD-10-CM

## 2018-02-26 DIAGNOSIS — M6281 Muscle weakness (generalized): Secondary | ICD-10-CM

## 2018-02-26 NOTE — Therapy (Signed)
[place], [place], [address] Phone: [phone]   Fax:  [phone]  Physical Therapy Treatment  Patient Details  Name: Calvin Schmidt MRN: [id number] Date of Birth: [birth year] Referring Provider (PT): Calvin Lemon, MD   Encounter Date: 02/26/2018  PT End of Session - 02/26/18 0923    Visit Number  7    Number of Visits  12    Date for PT Re-Evaluation  03/03/18    PT Start Time  0843    PT Stop Time  0923    PT Time Calculation (min)  40 min    Activity Tolerance  Patient tolerated treatment well    Behavior During Therapy  [facility] for tasks assessed/performed       History reviewed. No pertinent past medical history.  Past Surgical History:  Procedure Laterality Date  . KNEE SURGERY      There were no vitals filed for this visit.  Subjective Assessment - 02/26/18 0848    Subjective  Just have an ache.  Reports that he tried to get up off the couch and run to the door since someone was ringing the bell and his knee buckled and the pain was a 6/10, able to go down stairs step over step    Currently in Pain?  Yes    Pain Score  5     Pain Location  Knee    Pain Orientation  Left    Pain Descriptors / Indicators  Aching;Sore    Aggravating Factors   trying to go fast                       [facility] Adult PT Treatment/Exercise - 02/26/18 0001      Ambulation/Gait   Gait Comments  1 flight of stairs. eccentric load weaknee with LLE       Knee/Hip Exercises: Aerobic   Elliptical  R=6, I=8 x 6 mins 3/3    Recumbent Bike  L2 x4 min       Knee/Hip Exercises: Machines for Strengthening   Cybex Knee Extension  LLE 15lb 2x10    Cybex Knee Flexion  LLE 35lb 2x15    Cybex Leg Press  LLE 50lb 2x15       Knee/Hip Exercises: Standing   Heel Raises  Left;2 sets;10 reps;2 seconds    Forward Step Up  Left;2 sets;10 reps;Hand Hold: 0;Step Height: 6"   green Tband TKE resistance    Other Standing Knee Exercises  Controlled decents 6in 2x10, Hip adb/add/ext 10lb 2x10                   PT Long Term Goals - 02/19/18 6962      PT LONG TERM GOAL #1   Title  Pt will be I and compliant with HEP. 6 weeks 01/31/18    Status  Achieved      PT LONG TERM GOAL #2   Title  Pt will improve FOTO to less than 32% limited to improve function. 6 weeks 01/31/18    Status  On-going      PT LONG TERM GOAL #3   Title  Pt will improve Lt quad strength to 5/5 MMT. 6 weeks 01/31/18    Status  Partially Met      PT LONG TERM GOAL #4   Title  Pt will be able to lift at least 75 lbs from floor to waist without pain or  difficulty. 6 weeks 01/31/18    Status  Partially Met      PT LONG TERM GOAL #5   Title  Pt will be able to perform 14 stairs reciprocally without UE support or pain. 6 weeks 01/31/18            Plan - 02/26/18 0923    Clinical Impression Statement  Pt demos eccentric load weakness with LLE descending stairs. Pt also reports some pain when descending stairs. Visible fatigue and shaking with controlled descents on six inch box.     Rehab Potential  Good    Clinical Impairments Affecting Rehab Potential  physical nature of job, and second time tearing patella tendon    PT Frequency  2x / week    PT Duration  6 weeks    PT Treatment/Interventions  Cryotherapy;Electrical Stimulation;Iontophoresis 59m/ml Dexamethasone;Moist Heat;Ultrasound;Stair training;Therapeutic exercise;Therapeutic activities;Neuromuscular re-education;Passive range of motion;Dry needling;Taping;Vasopneumatic Device    PT Next Visit Plan  Slowly progress with L quad strength       Patient will benefit from skilled therapeutic intervention in order to improve the following deficits and impairments:  Decreased activity tolerance, Decreased endurance, Decreased range of motion, Decreased strength, Impaired flexibility, Pain  Visit Diagnosis: Muscle weakness (generalized)  Acute pain of  left knee     Problem List Patient Active Problem List   Diagnosis Date Noted  . Obesity (BMI 30-39.9) 11/10/2015  . Benign hypertension with CKD (chronic kidney disease) stage III ([facility] 05/10/2015    RScot Jun PTA 02/26/2018, 9:27 AM  [place] [address]Phone: [phone]  Fax:  [phone] Name: Calvin JindraMRN: [id number] of Birth: [birth year]

## 2018-03-01 ENCOUNTER — Encounter: Payer: BLUE CROSS/BLUE SHIELD | Admitting: Physical Therapy

## 2018-03-20 ENCOUNTER — Encounter: Payer: Self-pay | Admitting: Family Medicine

## 2018-03-20 ENCOUNTER — Ambulatory Visit (INDEPENDENT_AMBULATORY_CARE_PROVIDER_SITE_OTHER): Payer: BLUE CROSS/BLUE SHIELD | Admitting: Family Medicine

## 2018-03-20 VITALS — BP 135/90 | HR 96 | Ht 70.0 in | Wt 260.0 lb

## 2018-03-20 DIAGNOSIS — S8992XD Unspecified injury of left lower leg, subsequent encounter: Secondary | ICD-10-CM

## 2018-03-20 NOTE — Progress Notes (Signed)
PCP: Andreas Blowerabeza, Yuri M., MD  Subjective:   HPI: Patient is a 43 y.o. male here for left knee pain.  9/10: Patient reports back in 2009 he injured his left knee playing football - tore patellar tendon and had reconstruction. He improved and has had flares of gout at times in this knee. Then about a week ago he recalls turning wrong and feeling sharp pain with gradual swelling of anterior left knee. Difficulty walking and straightening his left knee since then. No fever, redness, numbness, other skin changes. Not tried anything for this yet. At rest pain 0/10 but sharp with walking, trying to straighten knee.  9/24: Patient reports he's doing better. Pain level down to 3/10. Wearing bledsoe brace locked to full extension. Has been icing, taking some ibuprofen, rare norco. No skin changes.  10/15: Patient reports he feels about the same. Pain level 2/10, feels with leaning on it or kneeling down. He's wearing his bledsoe brace when up and walking around. No skin changes.  11/12: Patient reports he feels about the same. Has only done 2 visits of physical therapy to date and some home exercises. Had trouble doing even 10 pound knee extensions left leg. Wearing knee brace but not currently. Pain level 3/10 and sore, worse kneeling. Has been icing. No skin changes.  12/17: Patient has done one visit of PT since last visit but states he's doing home exercises daily. Pain anterior knee and behind patella, in patellar tendon area. Some clicking, popping. Pain 0/10 but up to 6/10 at times. Able to do stairs better. No skin changes.  03/20/18: Patient reports he feels improved compared to last visit. Knee feels stronger. Has done physical therapy (12 visits) and continues with home exercise program. Pain level up to 3/10. Using nitro patches. Went to run to door a couple weeks ago and felt his knee give out. No skin changes.  History reviewed. No pertinent past medical  history.  Current Outpatient Medications on File Prior to Visit  Medication Sig Dispense Refill  . albuterol (PROVENTIL HFA;VENTOLIN HFA) 108 (90 BASE) MCG/ACT inhaler Inhale 1-2 puffs into the lungs every 6 (six) hours as needed for wheezing. 1 Inhaler 0  . allopurinol (ZYLOPRIM) 100 MG tablet   5  . amLODipine (NORVASC) 10 MG tablet   4  . diclofenac sodium (VOLTAREN) 1 % GEL Apply 2 g topically 4 (four) times daily. 3 Tube 1  . hydrochlorothiazide (HYDRODIURIL) 12.5 MG tablet TAKE 1 TABLET PO QD  5  . lisinopril (PRINIVIL,ZESTRIL) 10 MG tablet Take by mouth.    . nitroGLYCERIN (NITRODUR - DOSED IN MG/24 HR) 0.2 mg/hr patch Apply 1/4th patch to affected area, change daily 30 patch 1  . Vitamin D, Ergocalciferol, (DRISDOL) 50000 units CAPS capsule TK 1 C PO 1 TIME A WK  5   No current facility-administered medications on file prior to visit.     Past Surgical History:  Procedure Laterality Date  . KNEE SURGERY      No Known Allergies  Social History   Socioeconomic History  . Marital status: Single    Spouse name: Not on file  . Number of children: Not on file  . Years of education: Not on file  . Highest education level: Not on file  Occupational History  . Not on file  Social Needs  . Financial resource strain: Not on file  . Food insecurity:    Worry: Not on file    Inability: Not on file  .  Transportation needs:    Medical: Not on file    Non-medical: Not on file  Tobacco Use  . Smoking status: Never Smoker  . Smokeless tobacco: Never Used  Substance and Sexual Activity  . Alcohol use: Not on file  . Drug use: No  . Sexual activity: Not on file  Lifestyle  . Physical activity:    Days per week: Not on file    Minutes per session: Not on file  . Stress: Not on file  Relationships  . Social connections:    Talks on phone: Not on file    Gets together: Not on file    Attends religious service: Not on file    Active member of club or organization: Not on  file    Attends meetings of clubs or organizations: Not on file    Relationship status: Not on file  . Intimate partner violence:    Fear of current or ex partner: Not on file    Emotionally abused: Not on file    Physically abused: Not on file    Forced sexual activity: Not on file  Other Topics Concern  . Not on file  Social History Narrative  . Not on file    History reviewed. No pertinent family history.  BP 135/90   Pulse 96   Ht 5\' 10"  (1.778 m)   Wt 260 lb (117.9 kg)   BMI 37.31 kg/m   Review of Systems: See HPI above.     Objective:  Physical Exam:  Gen: NAD, comfortable in exam room  Left knee: Mild swelling over patellar tendon.  Well healed surgical scar.  No other gross deformity, ecchymoses, swelling. Mild TTP over patellar tendon.  No joint line, other tenderness. FROM with 5/5 strength flexion and extension - pain with extension. Negative ant/post drawers. Negative valgus/varus testing. Negative lachmans. Negative mcmurrays, apleys, patellar apprehension. NV intact distally.   Assessment & Plan:  1. Left knee injury - 2/2 partial patellar tendon tear with tendinitis.  Some improvement.  Will increase nitro patch dosage.  Continue HEP.  Light duty at work.  F/u in 6 weeks.  Consider MRI if not improving as will be 6 months into treatment at that time.

## 2018-03-20 NOTE — Patient Instructions (Signed)
Increase the nitro patch to 1/2 patch and change daily. Continue your home exercises daily. Follow up with me in 6 weeks. If not improving that will be 6 months from start of treatment - would consider MRI at that point, possible surgical referral.

## 2018-04-10 ENCOUNTER — Telehealth: Payer: Self-pay | Admitting: Family Medicine

## 2018-04-10 NOTE — Addendum Note (Signed)
Addended by: Kathi Simpers F on: 04/10/2018 02:36 PM   Modules accepted: Orders

## 2018-04-10 NOTE — Telephone Encounter (Signed)
Ok to go ahead with this.  Possible his insurance will require x-rays beforehand but check first.  Thanks!

## 2018-04-10 NOTE — Telephone Encounter (Signed)
Patient would like to go ahead with an MRI of his left knee

## 2018-04-10 NOTE — Telephone Encounter (Signed)
Order placed and sent for MRI.

## 2018-04-14 ENCOUNTER — Ambulatory Visit
Admission: RE | Admit: 2018-04-14 | Discharge: 2018-04-14 | Disposition: A | Discharge status: Home / Self Care | Payer: BLUE CROSS/BLUE SHIELD | Source: Ambulatory Visit | Attending: Family Medicine | Admitting: Family Medicine

## 2018-04-14 DIAGNOSIS — S8992XD Unspecified injury of left lower leg, subsequent encounter: Secondary | ICD-10-CM

## 2018-04-14 DIAGNOSIS — M25561 Pain in right knee: Secondary | ICD-10-CM | POA: Diagnosis not present

## 2018-04-18 NOTE — Addendum Note (Signed)
Addended by: Kathi Simpers F on: 04/18/2018 03:49 PM   Modules accepted: Orders

## 2018-04-29 ENCOUNTER — Ambulatory Visit (INDEPENDENT_AMBULATORY_CARE_PROVIDER_SITE_OTHER): Payer: BLUE CROSS/BLUE SHIELD | Admitting: Family Medicine

## 2018-04-29 ENCOUNTER — Encounter: Payer: Self-pay | Admitting: Family Medicine

## 2018-04-29 VITALS — BP 135/88 | HR 89 | Ht 70.0 in | Wt 260.0 lb

## 2018-04-29 DIAGNOSIS — S8992XD Unspecified injury of left lower leg, subsequent encounter: Secondary | ICD-10-CM | POA: Diagnosis not present

## 2018-04-29 NOTE — Progress Notes (Signed)
PCP: Andreas Blower., MD  Subjective:   HPI: Patient is a 43 y.o. male here for left knee pain.  9/10: Patient reports back in 2009 he injured his left knee playing football - tore patellar tendon and had reconstruction. He improved and has had flares of gout at times in this knee. Then about a week ago he recalls turning wrong and feeling sharp pain with gradual swelling of anterior left knee. Difficulty walking and straightening his left knee since then. No fever, redness, numbness, other skin changes. Not tried anything for this yet. At rest pain 0/10 but sharp with walking, trying to straighten knee.  9/24: Patient reports he's doing better. Pain level down to 3/10. Wearing bledsoe brace locked to full extension. Has been icing, taking some ibuprofen, rare norco. No skin changes.  10/15: Patient reports he feels about the same. Pain level 2/10, feels with leaning on it or kneeling down. He's wearing his bledsoe brace when up and walking around. No skin changes.  11/12: Patient reports he feels about the same. Has only done 2 visits of physical therapy to date and some home exercises. Had trouble doing even 10 pound knee extensions left leg. Wearing knee brace but not currently. Pain level 3/10 and sore, worse kneeling. Has been icing. No skin changes.  12/17: Patient has done one visit of PT since last visit but states he's doing home exercises daily. Pain anterior knee and behind patella, in patellar tendon area. Some clicking, popping. Pain 0/10 but up to 6/10 at times. Able to do stairs better. No skin changes.  03/20/18: Patient reports he feels improved compared to last visit. Knee feels stronger. Has done physical therapy (12 visits) and continues with home exercise program. Pain level up to 3/10. Using nitro patches. Went to run to door a couple weeks ago and felt his knee give out. No skin changes.  3/9: Patient reports some improvement since last  visit. Reports 3/10 pain at rest. Continues to have pain worse with squatting or going down stairs He does feel like his leg is getting stronger Not yet back to work. Localizes pain to behind the patella and patellar tendon He was previously referred to ortho, but states he was never contacted to schedule. He is currently using nitroglycerine patches. Tolerating well. No using other medications for pain No new swelling, erythema or bruising. No skin changes.   No past medical history on file.  Current Outpatient Medications on File Prior to Visit  Medication Sig Dispense Refill  . albuterol (PROVENTIL HFA;VENTOLIN HFA) 108 (90 BASE) MCG/ACT inhaler Inhale 1-2 puffs into the lungs every 6 (six) hours as needed for wheezing. 1 Inhaler 0  . allopurinol (ZYLOPRIM) 100 MG tablet   5  . amLODipine (NORVASC) 10 MG tablet   4  . diclofenac sodium (VOLTAREN) 1 % GEL Apply 2 g topically 4 (four) times daily. 3 Tube 1  . hydrochlorothiazide (HYDRODIURIL) 12.5 MG tablet TAKE 1 TABLET PO QD  5  . lisinopril (PRINIVIL,ZESTRIL) 10 MG tablet Take by mouth.    . nitroGLYCERIN (NITRODUR - DOSED IN MG/24 HR) 0.2 mg/hr patch Apply 1/4th patch to affected area, change daily 30 patch 1  . Vitamin D, Ergocalciferol, (DRISDOL) 50000 units CAPS capsule TK 1 C PO 1 TIME A WK  5   No current facility-administered medications on file prior to visit.     Past Surgical History:  Procedure Laterality Date  . KNEE SURGERY      No  Known Allergies  Social History   Socioeconomic History  . Marital status: Single    Spouse name: Not on file  . Number of children: Not on file  . Years of education: Not on file  . Highest education level: Not on file  Occupational History  . Not on file  Social Needs  . Financial resource strain: Not on file  . Food insecurity:    Worry: Not on file    Inability: Not on file  . Transportation needs:    Medical: Not on file    Non-medical: Not on file  Tobacco Use  .  Smoking status: Never Smoker  . Smokeless tobacco: Never Used  Substance and Sexual Activity  . Alcohol use: Not on file  . Drug use: No  . Sexual activity: Not on file  Lifestyle  . Physical activity:    Days per week: Not on file    Minutes per session: Not on file  . Stress: Not on file  Relationships  . Social connections:    Talks on phone: Not on file    Gets together: Not on file    Attends religious service: Not on file    Active member of club or organization: Not on file    Attends meetings of clubs or organizations: Not on file    Relationship status: Not on file  . Intimate partner violence:    Fear of current or ex partner: Not on file    Emotionally abused: Not on file    Physically abused: Not on file    Forced sexual activity: Not on file  Other Topics Concern  . Not on file  Social History Narrative  . Not on file    No family history on file.  BP 135/88   Pulse 89   Ht 5\' 10"  (1.778 m)   Wt 260 lb (117.9 kg)   BMI 37.31 kg/m   Review of Systems: See HPI above.     Objective:  Physical Exam:  GEN: awake, alert, NAD Pulm: breathing unlabored  LeftKnee: - Inspection: longitudinal well healed surgical scar. No swelling/effusion, erythema or bruising.  - Palpation: TTP primarily over the lateral PF joint. TTP also over the patellar tendon.  - ROM: full active ROM with flexion and extension in knee. Crepitus with passive ROM - Strength: 5/5 strength - Neuro/vasc: NV intact - Special Tests: - LIGAMENTS: negative Lachman's, no MCL or LCL laxity  -- MENISCUS: negative McMurray's   Assessment & Plan:  1. Left knee pain 2/2 injury to the patellar articular cartilage, healed partial patellar tendon tear with tendinopathy. Now greater than 6 months since initial injury. Will reengage with ortho and have him see them as previously planned. Continue light duty x2 more weeks until seen by ortho. Follow up as needed.

## 2018-05-09 DIAGNOSIS — M10362 Gout due to renal impairment, left knee: Secondary | ICD-10-CM | POA: Diagnosis not present

## 2018-05-09 DIAGNOSIS — S86812S Strain of other muscle(s) and tendon(s) at lower leg level, left leg, sequela: Secondary | ICD-10-CM | POA: Diagnosis not present

## 2018-05-09 DIAGNOSIS — E669 Obesity, unspecified: Secondary | ICD-10-CM | POA: Diagnosis not present

## 2018-05-09 DIAGNOSIS — M94262 Chondromalacia, left knee: Secondary | ICD-10-CM | POA: Diagnosis not present

## 2018-07-16 DIAGNOSIS — E669 Obesity, unspecified: Secondary | ICD-10-CM | POA: Diagnosis not present

## 2018-07-16 DIAGNOSIS — N183 Chronic kidney disease, stage 3 (moderate): Secondary | ICD-10-CM | POA: Diagnosis not present

## 2018-07-16 DIAGNOSIS — I129 Hypertensive chronic kidney disease with stage 1 through stage 4 chronic kidney disease, or unspecified chronic kidney disease: Secondary | ICD-10-CM | POA: Diagnosis not present

## 2018-07-18 DIAGNOSIS — S86812S Strain of other muscle(s) and tendon(s) at lower leg level, left leg, sequela: Secondary | ICD-10-CM | POA: Diagnosis not present

## 2018-07-18 DIAGNOSIS — M94262 Chondromalacia, left knee: Secondary | ICD-10-CM | POA: Diagnosis not present

## 2018-07-23 DIAGNOSIS — N183 Chronic kidney disease, stage 3 (moderate): Secondary | ICD-10-CM | POA: Diagnosis not present

## 2018-08-05 DIAGNOSIS — N183 Chronic kidney disease, stage 3 (moderate): Secondary | ICD-10-CM | POA: Diagnosis not present

## 2018-08-05 DIAGNOSIS — I129 Hypertensive chronic kidney disease with stage 1 through stage 4 chronic kidney disease, or unspecified chronic kidney disease: Secondary | ICD-10-CM | POA: Diagnosis not present

## 2018-08-05 DIAGNOSIS — E669 Obesity, unspecified: Secondary | ICD-10-CM | POA: Diagnosis not present

## 2018-08-15 DIAGNOSIS — M94262 Chondromalacia, left knee: Secondary | ICD-10-CM | POA: Diagnosis not present

## 2018-10-26 DIAGNOSIS — M10371 Gout due to renal impairment, right ankle and foot: Secondary | ICD-10-CM | POA: Diagnosis not present

## 2019-02-03 DIAGNOSIS — N183 Chronic kidney disease, stage 3 unspecified: Secondary | ICD-10-CM | POA: Diagnosis not present

## 2019-02-04 DIAGNOSIS — N183 Chronic kidney disease, stage 3 unspecified: Secondary | ICD-10-CM | POA: Diagnosis not present

## 2019-02-04 DIAGNOSIS — N1831 Chronic kidney disease, stage 3a: Secondary | ICD-10-CM | POA: Diagnosis not present

## 2019-02-04 DIAGNOSIS — I129 Hypertensive chronic kidney disease with stage 1 through stage 4 chronic kidney disease, or unspecified chronic kidney disease: Secondary | ICD-10-CM | POA: Diagnosis not present

## 2019-02-04 DIAGNOSIS — E669 Obesity, unspecified: Secondary | ICD-10-CM | POA: Diagnosis not present

## 2019-02-20 DIAGNOSIS — E785 Hyperlipidemia, unspecified: Secondary | ICD-10-CM | POA: Diagnosis not present

## 2019-02-20 DIAGNOSIS — E669 Obesity, unspecified: Secondary | ICD-10-CM | POA: Diagnosis not present

## 2019-02-20 DIAGNOSIS — M25541 Pain in joints of right hand: Secondary | ICD-10-CM | POA: Diagnosis not present

## 2019-02-20 DIAGNOSIS — N183 Chronic kidney disease, stage 3 unspecified: Secondary | ICD-10-CM | POA: Diagnosis not present

## 2019-02-20 DIAGNOSIS — M25542 Pain in joints of left hand: Secondary | ICD-10-CM | POA: Diagnosis not present

## 2019-02-20 DIAGNOSIS — I129 Hypertensive chronic kidney disease with stage 1 through stage 4 chronic kidney disease, or unspecified chronic kidney disease: Secondary | ICD-10-CM | POA: Diagnosis not present

## 2019-04-22 DIAGNOSIS — Z0289 Encounter for other administrative examinations: Secondary | ICD-10-CM | POA: Diagnosis not present

## 2019-05-26 DIAGNOSIS — E785 Hyperlipidemia, unspecified: Secondary | ICD-10-CM | POA: Diagnosis not present

## 2019-05-26 DIAGNOSIS — Z0000 Encounter for general adult medical examination without abnormal findings: Secondary | ICD-10-CM | POA: Diagnosis not present

## 2019-05-26 DIAGNOSIS — E669 Obesity, unspecified: Secondary | ICD-10-CM | POA: Diagnosis not present

## 2019-05-26 DIAGNOSIS — N183 Chronic kidney disease, stage 3 unspecified: Secondary | ICD-10-CM | POA: Diagnosis not present

## 2019-05-26 DIAGNOSIS — I129 Hypertensive chronic kidney disease with stage 1 through stage 4 chronic kidney disease, or unspecified chronic kidney disease: Secondary | ICD-10-CM | POA: Diagnosis not present

## 2019-07-22 DIAGNOSIS — M25562 Pain in left knee: Secondary | ICD-10-CM | POA: Diagnosis not present

## 2019-07-22 DIAGNOSIS — G8929 Other chronic pain: Secondary | ICD-10-CM | POA: Diagnosis not present

## 2019-07-25 DIAGNOSIS — M109 Gout, unspecified: Secondary | ICD-10-CM | POA: Diagnosis not present

## 2019-08-01 DIAGNOSIS — G8929 Other chronic pain: Secondary | ICD-10-CM | POA: Diagnosis not present

## 2019-08-01 DIAGNOSIS — M25562 Pain in left knee: Secondary | ICD-10-CM | POA: Diagnosis not present

## 2019-08-05 DIAGNOSIS — M25662 Stiffness of left knee, not elsewhere classified: Secondary | ICD-10-CM | POA: Diagnosis not present

## 2019-08-05 DIAGNOSIS — Z7409 Other reduced mobility: Secondary | ICD-10-CM | POA: Diagnosis not present

## 2019-08-05 DIAGNOSIS — R2689 Other abnormalities of gait and mobility: Secondary | ICD-10-CM | POA: Diagnosis not present

## 2019-08-05 DIAGNOSIS — M6281 Muscle weakness (generalized): Secondary | ICD-10-CM | POA: Diagnosis not present

## 2019-08-05 DIAGNOSIS — N1831 Chronic kidney disease, stage 3a: Secondary | ICD-10-CM | POA: Diagnosis not present

## 2019-08-05 DIAGNOSIS — R29898 Other symptoms and signs involving the musculoskeletal system: Secondary | ICD-10-CM | POA: Diagnosis not present

## 2019-08-05 DIAGNOSIS — M25562 Pain in left knee: Secondary | ICD-10-CM | POA: Diagnosis not present

## 2019-08-05 DIAGNOSIS — M62552 Muscle wasting and atrophy, not elsewhere classified, left thigh: Secondary | ICD-10-CM | POA: Diagnosis not present

## 2019-08-05 DIAGNOSIS — G8929 Other chronic pain: Secondary | ICD-10-CM | POA: Diagnosis not present

## 2019-08-06 DIAGNOSIS — N1831 Chronic kidney disease, stage 3a: Secondary | ICD-10-CM | POA: Diagnosis not present

## 2019-08-06 DIAGNOSIS — I129 Hypertensive chronic kidney disease with stage 1 through stage 4 chronic kidney disease, or unspecified chronic kidney disease: Secondary | ICD-10-CM | POA: Diagnosis not present

## 2019-08-06 DIAGNOSIS — E669 Obesity, unspecified: Secondary | ICD-10-CM | POA: Diagnosis not present

## 2019-08-06 DIAGNOSIS — N183 Chronic kidney disease, stage 3 unspecified: Secondary | ICD-10-CM | POA: Diagnosis not present

## 2019-10-14 DIAGNOSIS — M94262 Chondromalacia, left knee: Secondary | ICD-10-CM | POA: Diagnosis not present

## 2019-10-14 DIAGNOSIS — M109 Gout, unspecified: Secondary | ICD-10-CM | POA: Diagnosis not present

## 2019-10-14 DIAGNOSIS — N1831 Chronic kidney disease, stage 3a: Secondary | ICD-10-CM | POA: Diagnosis not present

## 2019-11-25 DIAGNOSIS — E785 Hyperlipidemia, unspecified: Secondary | ICD-10-CM | POA: Diagnosis not present

## 2019-11-25 DIAGNOSIS — E669 Obesity, unspecified: Secondary | ICD-10-CM | POA: Diagnosis not present

## 2019-11-25 DIAGNOSIS — N183 Chronic kidney disease, stage 3 unspecified: Secondary | ICD-10-CM | POA: Diagnosis not present

## 2019-11-25 DIAGNOSIS — I129 Hypertensive chronic kidney disease with stage 1 through stage 4 chronic kidney disease, or unspecified chronic kidney disease: Secondary | ICD-10-CM | POA: Diagnosis not present

## 2020-02-02 DIAGNOSIS — N183 Chronic kidney disease, stage 3 unspecified: Secondary | ICD-10-CM | POA: Diagnosis not present

## 2020-02-02 DIAGNOSIS — E669 Obesity, unspecified: Secondary | ICD-10-CM | POA: Diagnosis not present

## 2020-02-02 DIAGNOSIS — I129 Hypertensive chronic kidney disease with stage 1 through stage 4 chronic kidney disease, or unspecified chronic kidney disease: Secondary | ICD-10-CM | POA: Diagnosis not present

## 2020-02-02 DIAGNOSIS — N1831 Chronic kidney disease, stage 3a: Secondary | ICD-10-CM | POA: Diagnosis not present

## 2020-05-16 IMAGING — MR MR KNEE*L* W/O CM
6 series · 40 of 40 positions shown · non-contrast
Comparison: Left knee x-rays dated [DATE] and 10, 4507.

CLINICAL DATA: Chronic right knee pain around the patella since
last [REDACTED].

EXAM:
MRI OF THE LEFT KNEE WITHOUT CONTRAST
TECHNIQUE: Multiplanar, multisequence MR imaging of the knee was performed. No
intravenous contrast was administered.

[Series 3: T2 fat-sat · axial · 4.0mm · 0.62mm/px · z∈[-67,+73]mm · 7 of 29 slices shown (1 of 3)]
[im 1/29]
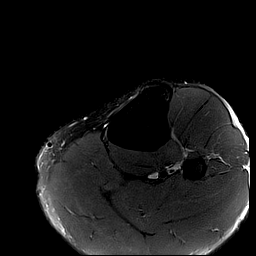
[im 5/29]
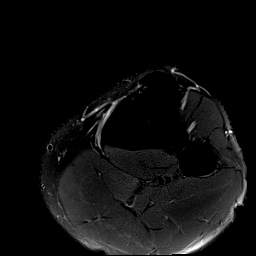
[im 10/29]
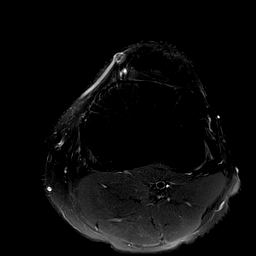
[im 15/29]
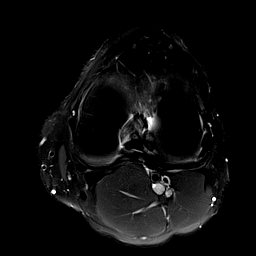
[im 19/29]
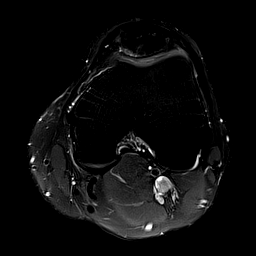
[im 24/29]
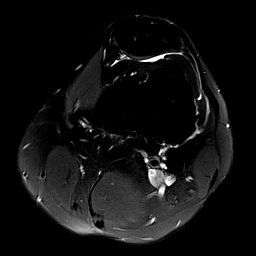
[im 29/29]
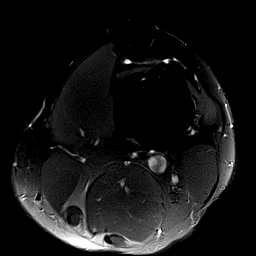

[Series 4: T1 · coronal · 4.0mm · 0.47mm/px · 6 of 25 slices shown]
[im 1/25]
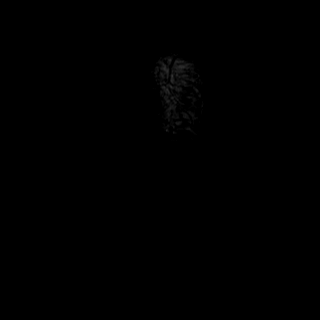
[im 5/25]
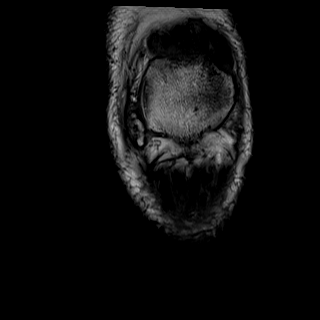
[im 10/25]
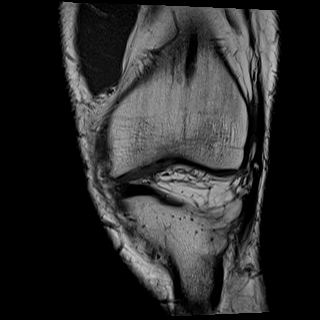
[im 15/25]
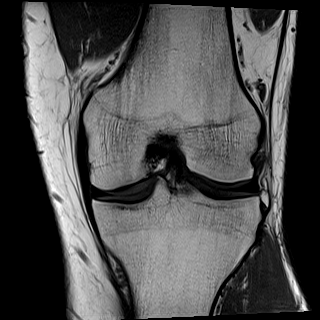
[im 20/25]
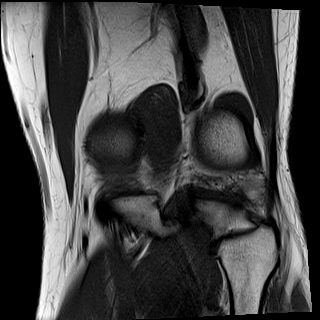
[im 25/25]
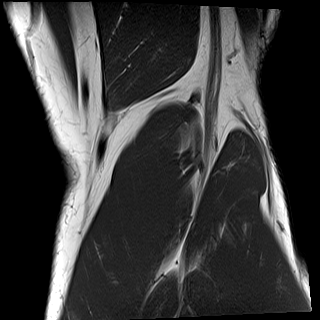

[Series 5: T2 fat-sat · coronal · 4.0mm · 0.59mm/px · 6 of 25 slices shown (2 of 3)]
[im 1/25]
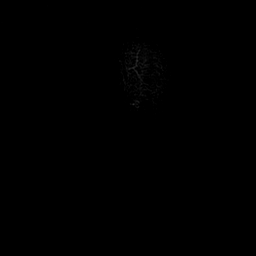
[im 5/25]
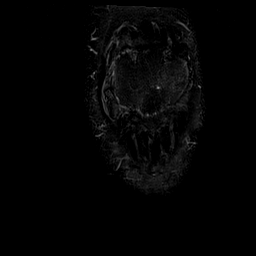
[im 10/25]
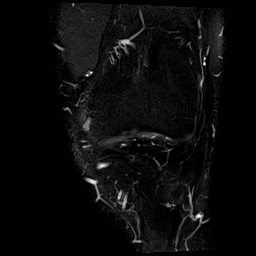
[im 15/25]
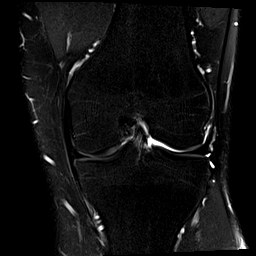
[im 20/25]
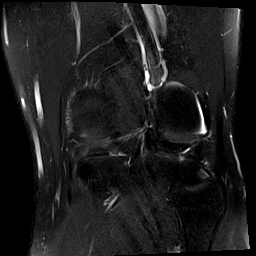
[im 25/25]
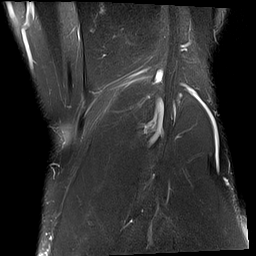

[Series 6: PD fat-sat · coronal · 3.0mm · 0.59mm/px · 7 of 30 slices shown (1 of 2)]
[im 1/30]
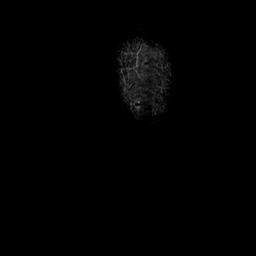
[im 5/30]
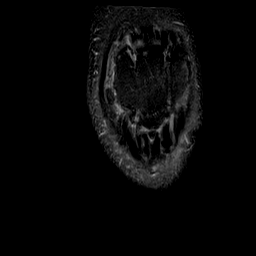
[im 10/30]
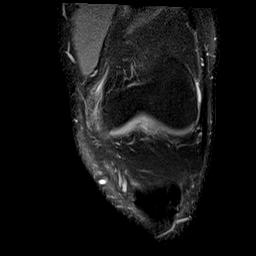
[im 15/30]
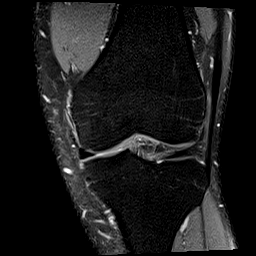
[im 20/30]
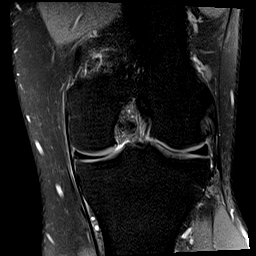
[im 25/30]
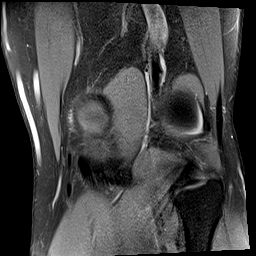
[im 30/30]
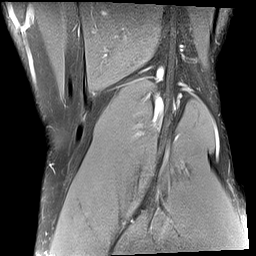

[Series 7: PD fat-sat · sagittal · 3.0mm · 0.59mm/px · 7 of 30 slices shown (2 of 2)]
[im 1/30]
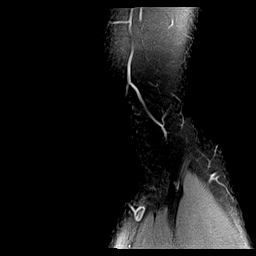
[im 5/30]
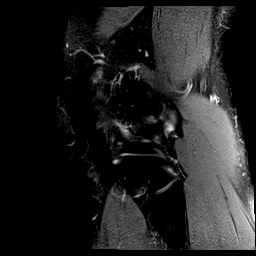
[im 10/30]
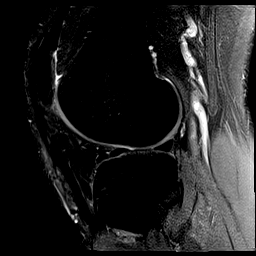
[im 15/30]
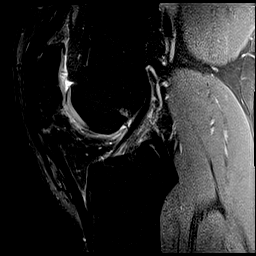
[im 20/30]
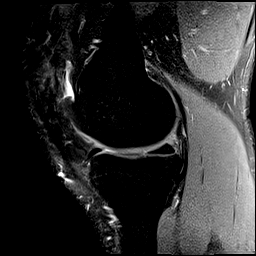
[im 25/30]
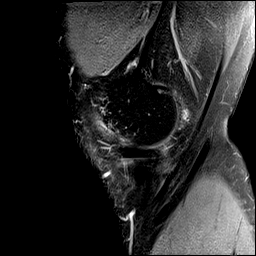
[im 30/30]
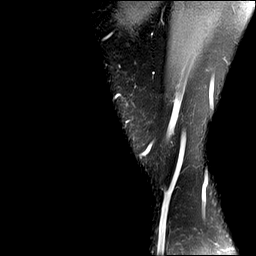

[Series 8: T2 fat-sat · sagittal · 3.0mm · 0.59mm/px · 7 of 30 slices shown (3 of 3)]
[im 1/30]
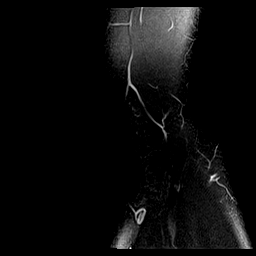
[im 5/30]
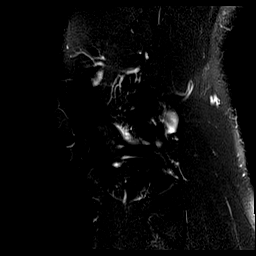
[im 10/30]
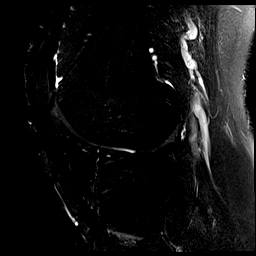
[im 15/30]
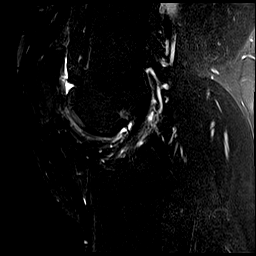
[im 20/30]
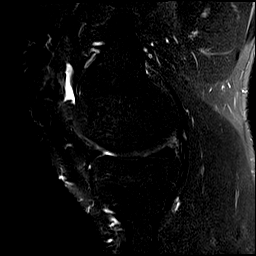
[im 25/30]
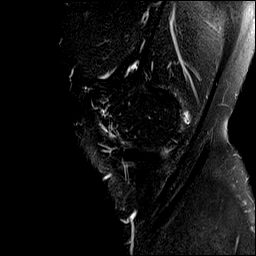
[im 30/30]
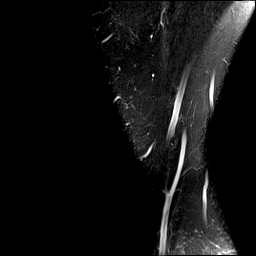

[40 of 40 positions shown; findings below may reference images not displayed]

FINDINGS: MENISCI

Medial meniscus:  Intact.

Lateral meniscus:  Intact.

LIGAMENTS

Cruciates:  Intact ACL and PCL.

Collaterals: Medial collateral ligament is intact. Lateral
collateral ligament complex is intact.

CARTILAGE

Patellofemoral: High-grade near full-thickness fissuring and
delamination of the cartilage overlying the patellar apex extending
into the lateral patellar facet. Trochlear cartilage is preserved.

Medial:  No chondral defect.

Lateral: Mild degenerative signal within the cartilage overlying the
medial aspect of the lateral tibial plateau. No focal defect.

Joint: No joint effusion. Normal Hoffa's fat. No plical thickening.

Popliteal Fossa:  No Baker cyst. Intact popliteus tendon.

Extensor Mechanism: Intact patellar tendon with evidence of prior
repair. Thickening and intermediate signal is likely postsurgical.
Intact quadriceps tendon. Intact medial and lateral patellar
retinaculum. Intact MPFL.

Bones:  No acute fracture or dislocation.  No focal bone lesion.

Other: None.
IMPRESSION: 1. High-grade near full-thickness cartilage fissuring and
delamination overlying the patellar apex extending into the lateral
patellar facet.
2. No meniscal or ligamentous injury.
3. Prior patellar tendon repair.
